# Patient Record
Sex: Female | Born: 1985
Health system: Southern US, Community
[De-identification: ages and names within clinical notes are randomized; demographics above are authoritative.]

## PROBLEM LIST (undated history)

## (undated) ENCOUNTER — Ambulatory Visit: Admission: EM | Payer: BC Managed Care – PPO | Source: Home / Self Care

## (undated) DIAGNOSIS — C801 Malignant (primary) neoplasm, unspecified: Secondary | ICD-10-CM

## (undated) DIAGNOSIS — K602 Anal fissure, unspecified: Secondary | ICD-10-CM

## (undated) DIAGNOSIS — R519 Headache, unspecified: Secondary | ICD-10-CM

## (undated) DIAGNOSIS — F32A Depression, unspecified: Secondary | ICD-10-CM

## (undated) DIAGNOSIS — F909 Attention-deficit hyperactivity disorder, unspecified type: Secondary | ICD-10-CM

## (undated) DIAGNOSIS — T7422XA Child sexual abuse, confirmed, initial encounter: Secondary | ICD-10-CM

## (undated) DIAGNOSIS — R87629 Unspecified abnormal cytological findings in specimens from vagina: Secondary | ICD-10-CM

## (undated) DIAGNOSIS — F419 Anxiety disorder, unspecified: Secondary | ICD-10-CM

## (undated) DIAGNOSIS — R51 Headache: Secondary | ICD-10-CM

## (undated) DIAGNOSIS — D219 Benign neoplasm of connective and other soft tissue, unspecified: Secondary | ICD-10-CM

## (undated) DIAGNOSIS — B009 Herpesviral infection, unspecified: Secondary | ICD-10-CM

## (undated) DIAGNOSIS — F329 Major depressive disorder, single episode, unspecified: Secondary | ICD-10-CM

## (undated) HISTORY — PX: ANUS SURGERY: SHX302

## (undated) HISTORY — DX: Unspecified abnormal cytological findings in specimens from vagina: R87.629

## (undated) HISTORY — PX: TONSILLECTOMY: SUR1361

## (undated) HISTORY — PX: BASAL CELL CARCINOMA EXCISION: SHX1214

## (undated) HISTORY — DX: Depression, unspecified: F32.A

## (undated) HISTORY — DX: Child sexual abuse, confirmed, initial encounter: T74.22XA

## (undated) HISTORY — DX: Headache: R51

## (undated) HISTORY — DX: Attention-deficit hyperactivity disorder, unspecified type: F90.9

## (undated) HISTORY — DX: Anxiety disorder, unspecified: F41.9

## (undated) HISTORY — DX: Major depressive disorder, single episode, unspecified: F32.9

## (undated) HISTORY — DX: Benign neoplasm of connective and other soft tissue, unspecified: D21.9

## (undated) HISTORY — DX: Anal fissure, unspecified: K60.2

## (undated) HISTORY — DX: Herpesviral infection, unspecified: B00.9

## (undated) HISTORY — DX: Headache, unspecified: R51.9

---

## 2007-12-21 ENCOUNTER — Emergency Department: Payer: Self-pay | Admitting: Emergency Medicine

## 2007-12-21 ENCOUNTER — Other Ambulatory Visit: Payer: Self-pay

## 2010-04-12 ENCOUNTER — Ambulatory Visit: Payer: Self-pay | Admitting: Internal Medicine

## 2010-05-04 ENCOUNTER — Emergency Department: Payer: Self-pay | Admitting: Emergency Medicine

## 2010-05-06 ENCOUNTER — Ambulatory Visit: Payer: Self-pay | Admitting: Emergency Medicine

## 2010-10-27 ENCOUNTER — Observation Stay: Payer: Self-pay

## 2011-06-06 ENCOUNTER — Ambulatory Visit: Payer: Self-pay | Admitting: Surgery

## 2012-10-19 ENCOUNTER — Ambulatory Visit: Payer: Self-pay | Admitting: Obstetrics and Gynecology

## 2013-01-14 ENCOUNTER — Ambulatory Visit: Payer: Self-pay | Admitting: Otolaryngology

## 2014-01-01 ENCOUNTER — Observation Stay: Payer: Self-pay | Admitting: Obstetrics and Gynecology

## 2014-01-01 LAB — PIH PROFILE
AST: 18 U/L (ref 15–37)
Anion Gap: 9 (ref 7–16)
BUN: 9 mg/dL (ref 7–18)
CREATININE: 0.9 mg/dL (ref 0.60–1.30)
Calcium, Total: 8.6 mg/dL (ref 8.5–10.1)
Chloride: 109 mmol/L — ABNORMAL HIGH (ref 98–107)
Co2: 21 mmol/L (ref 21–32)
EGFR (Non-African Amer.): >60
Glucose: 84 mg/dL (ref 65–99)
HCT: 41.1 % (ref 35.0–47.0)
HGB: 13.7 g/dL (ref 12.0–16.0)
MCH: 29.1 pg (ref 26.0–34.0)
MCHC: 33.4 g/dL (ref 32.0–36.0)
MCV: 87 fL (ref 80–100)
Osmolality: 275 (ref 275–301)
Platelet: 268 10*3/uL (ref 150–440)
Potassium: 4 mmol/L (ref 3.5–5.1)
RBC: 4.73 10*6/uL (ref 3.80–5.20)
RDW: 14.5 % (ref 11.5–14.5)
Sodium: 139 mmol/L (ref 136–145)
Uric Acid: 5.5 mg/dL (ref 2.6–6.0)
WBC: 11.4 10*3/uL — ABNORMAL HIGH (ref 3.6–11.0)

## 2014-01-10 ENCOUNTER — Observation Stay: Payer: Self-pay | Admitting: Obstetrics and Gynecology

## 2014-01-17 ENCOUNTER — Observation Stay: Payer: Self-pay | Admitting: Obstetrics and Gynecology

## 2014-01-21 ENCOUNTER — Ambulatory Visit: Payer: Self-pay | Admitting: Obstetrics and Gynecology

## 2014-01-21 ENCOUNTER — Observation Stay: Payer: Self-pay | Admitting: Obstetrics and Gynecology

## 2014-01-21 LAB — CBC WITH DIFFERENTIAL/PLATELET
BASOS PCT: 0.2 %
Basophil #: 0 10*3/uL (ref 0.0–0.1)
EOS ABS: 0.1 10*3/uL (ref 0.0–0.7)
Eosinophil %: 0.6 %
HCT: 42.4 % (ref 35.0–47.0)
HGB: 14.1 g/dL (ref 12.0–16.0)
Lymphocyte #: 2 10*3/uL (ref 1.0–3.6)
Lymphocyte %: 19.7 %
MCH: 29.1 pg (ref 26.0–34.0)
MCHC: 33.2 g/dL (ref 32.0–36.0)
MCV: 88 fL (ref 80–100)
MONO ABS: 0.6 x10 3/mm (ref 0.2–0.9)
Monocyte %: 6.1 %
Neutrophil #: 7.4 10*3/uL — ABNORMAL HIGH (ref 1.4–6.5)
Neutrophil %: 73.4 %
Platelet: 247 10*3/uL (ref 150–440)
RBC: 4.84 10*6/uL (ref 3.80–5.20)
RDW: 15.6 % — ABNORMAL HIGH (ref 11.5–14.5)
WBC: 10 10*3/uL (ref 3.6–11.0)

## 2014-01-24 ENCOUNTER — Emergency Department: Payer: Self-pay | Admitting: Emergency Medicine

## 2014-01-24 ENCOUNTER — Inpatient Hospital Stay: Payer: Self-pay | Admitting: Obstetrics and Gynecology

## 2014-01-25 LAB — HEMATOCRIT: HCT: 38.4 % (ref 35.0–47.0)

## 2014-06-15 LAB — HEMOGLOBIN A1C: Hgb A1c MFr Bld: 5.3 % (ref 4.0–6.0)

## 2014-06-15 LAB — LIPID PANEL
CHOLESTEROL: 145 mg/dL (ref 0–200)
HDL: 35 mg/dL (ref 35–70)
LDL Cholesterol: 87 mg/dL
TRIGLYCERIDES: 113 mg/dL (ref 40–160)

## 2014-06-15 LAB — BASIC METABOLIC PANEL: GLUCOSE: 101 mg/dL

## 2014-06-15 LAB — TSH: TSH: 1.39 u[IU]/mL (ref 0.41–5.90)

## 2014-08-06 NOTE — Op Note (Signed)
PATIENT NAME:  Sarah Lawrence, HACKEL MR#:  [id number] DATE OF BIRTH:  February 15, 1986  DATE OF PROCEDURE:  01/24/2014  PREOPERATIVE DIAGNOSES:  1.  Intrauterine pregnancy at [redacted] weeks gestation.  2.  Prior Cesarean section x 1.   POSTOPERATIVE DIAGNOSES:  1.  Intrauterine pregnancy at [redacted] weeks gestation.  2.  Prior Cesarean section x 1.  3.  Status post repeat low transverse Cesarean section.    PROCEDURE: Low transverse Cesarean section.   SURGEON: Rubie Maid, MD.    ASSISTANT: Barron Alvine, PA student.   ANESTHESIA:  Spinal.     FINDINGS: Cephalic presentation, female infant, Apgars of 9 and 9 at 1 and 5 minutes respectively.  Clear amniotic fluid at rupture. Adhesions of the uterus to the anterior abdominal wall, unable to visualize uterine outline, tubes, or ovaries.  Placenta appeared normal with 3 vessel cord.   ESTIMATED BLOOD LOSS: 800 mL.   URINE OUTPUT: 95 mL.    INTRAVENOUS FLUIDS: 1800 mL.    DRAINS: Foley catheter to gravity.   ANTIBIOTICS: 2 grams Ancef were given prior to the procedure.   SPECIMEN: Placenta.   COMPLICATIONS: There were none.   PROCEDURE: The patient was taken to the operating room where she was placed under spinal anesthesia without difficulty. She was then prepped and draped in normal fashion and placed in the dorsal supine position with leftward tilt. Spinal was found to be adequate. A Pfannenstiel incision was made and carried down through the subcutaneous tissue to the fascia using the Bovie. Fascia was then incised in the midline and the incision was extended laterally. The superior aspect of the fascial incision was then grasped Kober clamps, elevated, and the underlying rectus muscles were dissected off superiorly using sharp dissection. Attention was then turned inferiorly which in a similar fashion was grasped, tented up with Kocher clamps, and the rectus muscles were dissected off. The rectus muscles were then separated in the midline. The  peritoneum was identified, tented up, and entered sharply with Metzenbaum scissors.  Peritoneal incision was then extended superiorly and inferiorly with good visualization of the bladder.  The uterovesical peritoneal reflection was incised transversely and the bladder flap was bluntly freed from the lower uterine segment. The bladder blade was then inserted. A low transverse uterine incision was then made along the lower uterine segment with the scalpel.  Uterine incision was then extended laterally and superiorly bluntly. The bladder blade was removed and the infant's head was delivered atraumatically. There was no nuchal cord present. The nose and mouth were suctioned, The remainder of the infant was delivered without difficulty and the infant was handed off to the pediatricians. Delivered from vertex presentation was a 4250 gram female with Apgars of 9 and 9 at 1 and 5 minutes respectively. The umbilical cord was then clamped and cut. The placenta was removed manually and intact and appeared normal. The uterus was attempted to be exteriorized; however due to the dense abdominal adhesions the uterus could not be exteriorized. The uterus was then cleared of all clots and debris. The incision was then closed with running locked sutures of 0 Vicryl. A second suture was performed in an imbricating layer using 0 Vicryl. Hemostasis was observed. The gutters were cleared of all clots and debris. The fascia was then reapproximated with running suture of 1-0 Vicryl in a continuous fashion. Subcutaneous fat was reapproximated with a 3-0 Vicryl and interrupted sutures. The skin was reapproximated with 4-0 Monocryl. Dermabond was placed over the incision and  after this a pressure dressing was placed. Instrument, sponge, and needle counts were correct x 2 at the end of the procedure. The patient tolerated the procedure well. She and infant were taken to the recovery room in stable condition.     ____________________________ Chesley Noon Marcelline Mates, MD asc:bu D: 01/24/2014 14:55:42 ET T: 01/24/2014 15:23:27 ET JOB#: [id number]  cc: Chesley Noon. Marcelline Mates, MD, <Dictator> Augusto Gamble MD ELECTRONICALLY SIGNED 01/28/2014 23:52

## 2014-08-07 NOTE — Op Note (Signed)
PATIENT NAME:  Sarah Lawrence, Sarah Lawrence MR#:  [id number] DATE OF BIRTH:  Apr 26, 1985  DATE OF PROCEDURE:  06/06/2011  PREOPERATIVE DIAGNOSIS: Chronic posterior anal fissure.   POSTOPERATIVE DIAGNOSIS: Chronic posterior anal fissure.  PROCEDURE: Lateral internal anal sphincterotomy.   SURGEON: Rochel Brome, MD  ANESTHESIA: General.   INDICATIONS: This 29 year old female has a chronic history of anal pain with anal fissure, has been treated, medical management has failed, and surgery was recommended for definitive treatment.   DESCRIPTION OF PROCEDURE: The patient was placed on the operating table in the supine position under general anesthesia. The legs were elevated into the lithotomy position using ankle straps. The anal area was prepared with Betadine solution and draped with sterile towels and sheets.   Initial inspection revealed a posterior anal fissure and could visualize the white color of the internal anal sphincter. Digital exam demonstrated no palpable rectal mass. The anal canal was dilated large enough to admit three fingers. The bivalve anal retractor was introduced and further dilated the anal canal. Next, the skin at the 3 o'clock position on the patient's left side was infiltrated with 1% Xylocaine with epinephrine. Next, a 12 mm incision was made at the 3 o'clock position. Several small bleeding points were cauterized. Hemostats were used to dissect down to the internal anal sphincter, which was identified with white color. The sphincter was further dissected and exposed. Next, 90% of the sphincter was divided with electrocautery. Several small bleeding points were cauterized. Hemostasis was subsequently intact. Next, the wound was partially closed with interrupted 5-0 chromic simple sutures placing three sutures       and leaving the last 5 millimeters open for drainage. Next, dressings were applied with paper tape. The patient tolerated surgery satisfactorily and is now being prepared  for transfer to the recovery room. ____________________________ Lenna Sciara. Rochel Brome, MD jws:slb D: 06/06/2011 08:17:13 ET T: 06/06/2011 09:50:33 ET JOB#: [id number]  cc: Loreli Dollar, MD, <Dictator> Loreli Dollar MD ELECTRONICALLY SIGNED 06/09/2011 15:46

## 2014-09-20 ENCOUNTER — Encounter: Payer: Self-pay | Admitting: Psychiatry

## 2014-09-20 ENCOUNTER — Ambulatory Visit (INDEPENDENT_AMBULATORY_CARE_PROVIDER_SITE_OTHER): Payer: No Typology Code available for payment source | Admitting: Psychiatry

## 2014-09-20 ENCOUNTER — Other Ambulatory Visit: Payer: Self-pay

## 2014-09-20 VITALS — BP 110/76 | HR 87 | Temp 97.9°F | Ht 65.0 in

## 2014-09-20 DIAGNOSIS — F331 Major depressive disorder, recurrent, moderate: Secondary | ICD-10-CM

## 2014-09-20 DIAGNOSIS — F4541 Pain disorder exclusively related to psychological factors: Secondary | ICD-10-CM | POA: Insufficient documentation

## 2014-09-20 DIAGNOSIS — F508 Other eating disorders: Secondary | ICD-10-CM

## 2014-09-20 DIAGNOSIS — F5081 Binge eating disorder: Secondary | ICD-10-CM

## 2014-09-20 DIAGNOSIS — F900 Attention-deficit hyperactivity disorder, predominantly inattentive type: Secondary | ICD-10-CM

## 2014-09-20 DIAGNOSIS — F419 Anxiety disorder, unspecified: Secondary | ICD-10-CM | POA: Insufficient documentation

## 2014-09-20 DIAGNOSIS — F50819 Binge eating disorder, unspecified: Secondary | ICD-10-CM

## 2014-09-20 DIAGNOSIS — B009 Herpesviral infection, unspecified: Secondary | ICD-10-CM | POA: Insufficient documentation

## 2014-09-20 DIAGNOSIS — F411 Generalized anxiety disorder: Secondary | ICD-10-CM

## 2014-09-20 MED ORDER — LISDEXAMFETAMINE DIMESYLATE 30 MG PO CAPS: 30.0000 mg | ORAL_CAPSULE | Freq: Every day | ORAL | Status: DC

## 2014-09-20 MED ORDER — ALPRAZOLAM 1 MG PO TABS: 1.0000 mg | ORAL_TABLET | Freq: Three times a day (TID) | ORAL | Status: DC | PRN

## 2014-09-20 MED ORDER — PAROXETINE HCL 30 MG PO TABS: 30.0000 mg | ORAL_TABLET | Freq: Every day | ORAL | Status: DC
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
## 2014-10-03 ENCOUNTER — Ambulatory Visit: Payer: No Typology Code available for payment source | Admitting: Psychiatry

## 2014-10-04 ENCOUNTER — Ambulatory Visit: Payer: No Typology Code available for payment source | Admitting: Licensed Clinical Social Worker

## 2014-10-14 ENCOUNTER — Ambulatory Visit: Payer: Self-pay | Admitting: Obstetrics and Gynecology

## 2014-10-14 ENCOUNTER — Ambulatory Visit: Payer: No Typology Code available for payment source | Admitting: Licensed Clinical Social Worker

## 2014-10-20 ENCOUNTER — Encounter: Payer: Self-pay | Admitting: *Deleted

## 2014-10-25 ENCOUNTER — Ambulatory Visit: Payer: No Typology Code available for payment source | Admitting: Psychiatry

## 2014-10-25 ENCOUNTER — Ambulatory Visit (INDEPENDENT_AMBULATORY_CARE_PROVIDER_SITE_OTHER): Payer: No Typology Code available for payment source | Admitting: Obstetrics and Gynecology

## 2014-10-25 ENCOUNTER — Encounter: Payer: Self-pay | Admitting: Obstetrics and Gynecology

## 2014-10-25 VITALS — BP 122/80 | HR 72 | Ht 65.0 in | Wt 246.0 lb

## 2014-10-25 DIAGNOSIS — F5081 Binge eating disorder: Secondary | ICD-10-CM

## 2014-10-25 DIAGNOSIS — F508 Other eating disorders: Secondary | ICD-10-CM | POA: Diagnosis not present

## 2014-10-25 DIAGNOSIS — F900 Attention-deficit hyperactivity disorder, predominantly inattentive type: Secondary | ICD-10-CM

## 2014-10-25 MED ORDER — PHENTERMINE HCL 37.5 MG PO TABS: 37.5000 mg | ORAL_TABLET | Freq: Every day | ORAL | Status: DC

## 2014-10-25 MED ORDER — LISDEXAMFETAMINE DIMESYLATE 30 MG PO CAPS: 30.0000 mg | ORAL_CAPSULE | Freq: Every day | ORAL | Status: DC

## 2014-10-25 MED ORDER — CYANOCOBALAMIN 1000 MCG/ML IJ SOLN: 1000.0000 ug | Freq: Once | INTRAMUSCULAR | Status: DC

## 2014-10-25 NOTE — Patient Instructions (Signed)
Exercise to Lose Weight Exercise and a healthy diet may help you lose weight. Your doctor may suggest specific exercises. EXERCISE IDEAS AND TIPS  Choose low-cost things you enjoy doing, such as walking, bicycling, or exercising to workout videos.  Take stairs instead of the elevator.  Walk during your lunch break.  Park your car further away from work or school.  Go to a gym or an exercise class.  Start with 5 to 10 minutes of exercise each day. Build up to 30 minutes of exercise 4 to 6 days a week.  Wear shoes with good support and comfortable clothes.  Stretch before and after working out.  Work out until you breathe harder and your heart beats faster.  Drink extra water when you exercise.  Do not do so much that you hurt yourself, feel dizzy, or get very short of breath. Exercises that burn about 150 calories:  Running 1  miles in 15 minutes.  Playing volleyball for 45 to 60 minutes.  Washing and waxing a car for 45 to 60 minutes.  Playing touch football for 45 minutes.  Walking 1  miles in 35 minutes.  Pushing a stroller 1  miles in 30 minutes.  Playing basketball for 30 minutes.  Raking leaves for 30 minutes.  Bicycling 5 miles in 30 minutes.  Walking 2 miles in 30 minutes.  Dancing for 30 minutes.  Shoveling snow for 15 minutes.  Swimming laps for 20 minutes.  Walking up stairs for 15 minutes.  Bicycling 4 miles in 15 minutes.  Gardening for 30 to 45 minutes.  Jumping rope for 15 minutes.  Washing windows or floors for 45 to 60 minutes. Document Released: 05/04/2010 Document Revised: 06/24/2011 Document Reviewed: 05/04/2010 Exit[employer] Patient Information 2015 [employer]. This information is not intended to replace advice given to you by your health care provider. Make sure you discuss any questions you have with your health care provider.  

## 2014-10-25 NOTE — Progress Notes (Signed)
Subjective:     Patient ID: Sarah Lawrence, female   DOB: May 02, 1985, 29 y.o.   MRN: [id number]  HPI Here to start weight loss plan  Review of Systems Has been intermittently trying to loss weight without progress, has been seen by psychiatrist and taking vyvance x 1 month and feels like it has helped with ADD but not decreased appetite as desired.    Objective:   Physical Exam A&O x4 Morbidly obese female in no distress     Assessment:     Morbid obesity Depression ADD H/o eating disorder- binge eating     Plan:     Weight loss management including adipex 37.5mg  daily, and monthly B12 injections- RTC 4 weeks for recheck Committed to 19minutes of exercise at least 4 days a week Plans to restart Herbalife shakes  rx for vyvance 30mg  daily given- will consider increasing dose if no change seen with adding adipex.      Melody Trudee Kuster, CNM

## 2014-11-22 ENCOUNTER — Ambulatory Visit (INDEPENDENT_AMBULATORY_CARE_PROVIDER_SITE_OTHER): Payer: No Typology Code available for payment source | Admitting: Obstetrics and Gynecology

## 2014-11-22 VITALS — BP 118/72 | HR 74 | Ht 65.0 in | Wt 237.9 lb

## 2014-11-22 DIAGNOSIS — E669 Obesity, unspecified: Secondary | ICD-10-CM

## 2014-11-22 MED ORDER — CYANOCOBALAMIN 1000 MCG/ML IJ SOLN
1000.0000 ug | Freq: Once | INTRAMUSCULAR | Status: AC
  Administered 2014-11-22: 1000 ug via INTRAMUSCULAR

## 2014-11-22 MED ORDER — LISDEXAMFETAMINE DIMESYLATE 40 MG PO CAPS: 40.0000 mg | ORAL_CAPSULE | ORAL | Status: DC

## 2014-11-22 NOTE — Progress Notes (Cosign Needed)
Pt is here for wt, bp check, b-12 inj She is doing great with her diet and is tolerating medication well [id number] 10/25/2014

## 2014-12-20 ENCOUNTER — Ambulatory Visit (INDEPENDENT_AMBULATORY_CARE_PROVIDER_SITE_OTHER): Payer: Managed Care, Other (non HMO) | Admitting: Obstetrics and Gynecology

## 2014-12-20 VITALS — BP 109/76 | HR 93 | Ht 65.0 in | Wt 232.6 lb

## 2014-12-20 DIAGNOSIS — E669 Obesity, unspecified: Secondary | ICD-10-CM | POA: Diagnosis not present

## 2014-12-20 MED ORDER — CYANOCOBALAMIN 1000 MCG/ML IJ SOLN
1000.0000 ug | Freq: Once | INTRAMUSCULAR | Status: AC
  Administered 2014-12-20: 1000 ug via INTRAMUSCULAR

## 2014-12-20 NOTE — Progress Notes (Cosign Needed)
Patient ID: Sarah Lawrence, female   DOB: [birth year], 29 y.o.   MRN: [id number] Pt presents for weight, B/P check and B-12 injection.  Has lost 5 lbs since last visit. No c/o side effects of medication.  Does need refills for:  Alprazolam, Adderall, Vyvanse, and Paxil per pt. Pt also mentioned she would like a tubal in the spring and will make an appt with Dr. Marcelline Mates.

## 2014-12-21 ENCOUNTER — Telehealth: Payer: Self-pay | Admitting: Obstetrics and Gynecology

## 2014-12-21 ENCOUNTER — Other Ambulatory Visit: Payer: Self-pay | Admitting: Obstetrics and Gynecology

## 2014-12-21 DIAGNOSIS — F331 Major depressive disorder, recurrent, moderate: Secondary | ICD-10-CM

## 2014-12-21 DIAGNOSIS — F411 Generalized anxiety disorder: Secondary | ICD-10-CM

## 2014-12-21 MED ORDER — PAROXETINE HCL 30 MG PO TABS: 30.0000 mg | ORAL_TABLET | Freq: Every day | ORAL | Status: DC

## 2014-12-21 MED ORDER — LISDEXAMFETAMINE DIMESYLATE 40 MG PO CAPS: 40.0000 mg | ORAL_CAPSULE | ORAL | Status: DC

## 2014-12-21 MED ORDER — ALPRAZOLAM 1 MG PO TABS: 1.0000 mg | ORAL_TABLET | Freq: Three times a day (TID) | ORAL | Status: DC | PRN

## 2014-12-21 NOTE — Telephone Encounter (Signed)
Patient called requesting refills on paxil 30 mg, alprazolam 1 mg, and vyvanc 40 mg to [facility] in [place]

## 2014-12-21 NOTE — Telephone Encounter (Signed)
Patient called stating she has thrush due to the increased dosage on her ADD meds. She is requesting a script for mouthwash for the thrush.Thanks

## 2014-12-21 NOTE — Telephone Encounter (Signed)
Please let her know it is done 

## 2014-12-22 NOTE — Telephone Encounter (Signed)
Called pt.

## 2014-12-23 NOTE — Telephone Encounter (Signed)
Spoke with pt gave her some tips on dry mouth she voiced understanding

## 2015-01-13 ENCOUNTER — Telehealth: Payer: Self-pay | Admitting: Obstetrics and Gynecology

## 2015-01-13 NOTE — Telephone Encounter (Signed)
PT CALLED AND CX HER NURSE VISIT SHE HAS LOST 20 POUNDS AND SHE IS CONTENT WITH THAT BUT SHE WANTED TO KNOW IF YOU NEEDED TO SEE HER TO F/U WITH THE WEIGH LOSS OR IS SHE OK NOT TO BE SEEN, SHE SAID SHE WILL DO WHATEVER YOU NEED HER TO DO.

## 2015-01-17 ENCOUNTER — Ambulatory Visit: Payer: Self-pay

## 2015-01-17 NOTE — Telephone Encounter (Signed)
Spoke with pt she does want to continue to see MNB

## 2015-01-17 NOTE — Telephone Encounter (Signed)
I would still like to see her, as i usually have them continue with medication until BMI is <27, after a 2 week break of course. Now if she is not wanting to do that no need for an appointment.

## 2015-01-23 ENCOUNTER — Telehealth: Payer: Self-pay | Admitting: Obstetrics and Gynecology

## 2015-01-23 NOTE — Telephone Encounter (Signed)
pls advise

## 2015-01-23 NOTE — Telephone Encounter (Signed)
SHE NEEDS THE REMAINDER REFILLED ON HER RX B/C PHARMACY WOULD ONLY REFILL 90 OF THE 132 THAT MEL SENT. Duanne Moron   CELL [phone]

## 2015-01-24 NOTE — Telephone Encounter (Signed)
Left detailed message about medication 

## 2015-01-24 NOTE — Telephone Encounter (Signed)
I am not surprised- as they have regulation as to how many they can dispense at a time, tell her she will have to just go pick up remainder when they tell her she can.

## 2015-01-27 ENCOUNTER — Other Ambulatory Visit: Payer: Self-pay | Admitting: Obstetrics and Gynecology

## 2015-01-27 ENCOUNTER — Telehealth: Payer: Self-pay | Admitting: *Deleted

## 2015-01-27 DIAGNOSIS — F411 Generalized anxiety disorder: Secondary | ICD-10-CM

## 2015-01-27 MED ORDER — ALPRAZOLAM 1 MG PO TABS: 1.0000 mg | ORAL_TABLET | Freq: Three times a day (TID) | ORAL | Status: DC | PRN

## 2015-01-27 NOTE — Telephone Encounter (Signed)
Let her know it was done.

## 2015-01-27 NOTE — Telephone Encounter (Signed)
Pt needs rx for xanax takes 3x daily

## 2015-01-27 NOTE — Telephone Encounter (Signed)
Faxed rx to pharmacy  

## 2015-02-21 ENCOUNTER — Ambulatory Visit: Payer: Self-pay | Admitting: Obstetrics and Gynecology

## 2015-02-24 ENCOUNTER — Other Ambulatory Visit: Payer: Self-pay | Admitting: Obstetrics and Gynecology

## 2015-03-21 ENCOUNTER — Ambulatory Visit: Payer: Self-pay | Admitting: Obstetrics and Gynecology

## 2015-04-11 ENCOUNTER — Telehealth: Payer: Self-pay | Admitting: Obstetrics and Gynecology

## 2015-04-11 NOTE — Telephone Encounter (Signed)
pls advise

## 2015-04-11 NOTE — Telephone Encounter (Signed)
PT NEEDS VIVANCE REFILLED 40 MG. SHE HAS HER AE [facility] 1/19 [facility]

## 2015-04-11 NOTE — Telephone Encounter (Signed)
Please let her know it is ready for pick up

## 2015-04-11 NOTE — Telephone Encounter (Signed)
Notified pt she will pick up 04/12/15

## 2015-05-04 ENCOUNTER — Encounter: Payer: Self-pay | Admitting: Obstetrics and Gynecology

## 2015-05-04 ENCOUNTER — Ambulatory Visit (INDEPENDENT_AMBULATORY_CARE_PROVIDER_SITE_OTHER): Payer: Managed Care, Other (non HMO) | Admitting: Obstetrics and Gynecology

## 2015-05-04 ENCOUNTER — Ambulatory Visit: Payer: Self-pay | Admitting: Obstetrics and Gynecology

## 2015-05-04 ENCOUNTER — Other Ambulatory Visit: Payer: Self-pay | Admitting: Obstetrics and Gynecology

## 2015-05-04 VITALS — BP 120/68 | HR 74 | Ht 65.0 in | Wt 242.7 lb

## 2015-05-04 DIAGNOSIS — F411 Generalized anxiety disorder: Secondary | ICD-10-CM

## 2015-05-04 DIAGNOSIS — Z01419 Encounter for gynecological examination (general) (routine) without abnormal findings: Secondary | ICD-10-CM

## 2015-05-04 MED ORDER — LISDEXAMFETAMINE DIMESYLATE 40 MG PO CAPS: 40.0000 mg | ORAL_CAPSULE | ORAL | Status: DC

## 2015-05-04 MED ORDER — ALPRAZOLAM 1 MG PO TABS: 1.0000 mg | ORAL_TABLET | Freq: Three times a day (TID) | ORAL | Status: DC | PRN

## 2015-05-04 MED ORDER — FLUOXETINE HCL 20 MG PO CAPS: 20.0000 mg | ORAL_CAPSULE | Freq: Every day | ORAL | Status: DC

## 2015-05-04 NOTE — Progress Notes (Signed)
  Subjective:     Sarah Lawrence is a 30 y.o. female and is here for a comprehensive physical exam. The patient reports worsening anxiety due to child's health issues, also desires restarting weight loss medications.  Social History   Social History  . Marital Status: Married    Spouse Name: N/A  . Number of Children: N/A  . Years of Education: N/A   Occupational History  . Not on file.   Social History Main Topics  . Smoking status: Never Smoker   . Smokeless tobacco: Never Used  . Alcohol Use: 0.6 oz/week    1 Glasses of wine, 0 Standard drinks or equivalent per week  . Drug Use: No  . Sexual Activity: Yes    Birth Control/ Protection: Pill   Other Topics Concern  . Not on file   Social History Narrative   Health Maintenance  Topic Date Due  . HIV Screening  05/05/2001  . TETANUS/TDAP  05/05/2005  . PAP SMEAR  05/06/2007  . INFLUENZA VACCINE  11/14/2014    The following portions of the patient's history were reviewed and updated as appropriate: allergies, current medications, past family history, past medical history, past social history, past surgical history and problem list.  Review of Systems A comprehensive review of systems was negative except for: Behavioral/Psych: positive for anxiety and obesity   Objective:    General appearance: alert, cooperative, appears stated age and morbidly obese Neck: no adenopathy, no carotid bruit, no JVD, supple, symmetrical, trachea midline and thyroid not enlarged, symmetric, no tenderness/mass/nodules Lungs: clear to auscultation bilaterally Breasts: normal appearance, no masses or tenderness Heart: regular rate and rhythm, S1, S2 normal, no murmur, click, rub or gallop Abdomen: soft, non-tender; bowel sounds normal; no masses,  no organomegaly Pelvic: cervix normal in appearance, external genitalia normal, no adnexal masses or tenderness, no cervical motion tenderness, rectovaginal septum normal, uterus normal size, shape,  and consistency and vagina normal without discharge    Assessment:    Healthy female exam. Anxiety disorder- not under good control with current SSRI; morbid obesity , ADD     Plan:  Pap and labs obtained, to stop Paxil and start prozac- recheck in 2 weeks. meds refilled Will restart weight loss program in 2 weeks    See After Visit Summary for Counseling Recommendations

## 2015-05-04 NOTE — Patient Instructions (Signed)
  Place annual gynecologic exam patient instructions here.  Thank you for enrolling in [facility]. Please follow the instructions below to securely access your online medical record. MyChart allows you to send messages to your doctor, view your test results, manage appointments, and more.   How Do I Sign Up? 1. In your Internet browser, go to [employer] and enter [email or web address]. 2. Click on the Sign Up Now link in the Sign In box. You will see the New Member Sign Up page. 3. Enter your MyChart Access Code exactly as it appears below. You will not need to use this code after you've completed the sign-up process. If you do not sign up before the expiration date, you must request a new code.  MyChart Access Code: [email or web address] Expires: 05/19/2015 10:35 AM  4. Enter your Social Security Number ([id number]) and Date of Birth (mm/dd/yyyy) as indicated and click Submit. You will be taken to the next sign-up page. 5. Create a MyChart ID. This will be your MyChart login ID and cannot be changed, so think of one that is secure and easy to remember. 6. Create a MyChart password. You can change your password at any time. 7. Enter your Password Reset Question and Answer. This can be used at a later time if you forget your password.  8. Enter your e-mail address. You will receive e-mail notification when new information is available in [address]. 9. Click Sign Up. You can now view your medical record.   Additional Information Remember, MyChart is NOT to be used for urgent needs. For medical emergencies, dial 911.

## 2015-05-05 LAB — LIPID PANEL
CHOL/HDL RATIO: 5.3 ratio — AB (ref 0.0–4.4)
Cholesterol, Total: 168 mg/dL (ref 100–199)
HDL: 32 mg/dL — ABNORMAL LOW (ref >39)
LDL CALC: 96 mg/dL (ref 0–99)
TRIGLYCERIDES: 201 mg/dL — AB (ref 0–149)
VLDL CHOLESTEROL CAL: 40 mg/dL (ref 5–40)

## 2015-05-05 LAB — COMPREHENSIVE METABOLIC PANEL
A/G RATIO: 1.5 (ref 1.1–2.5)
ALBUMIN: 4.4 g/dL (ref 3.5–5.5)
ALT: 14 IU/L (ref 0–32)
AST: 13 IU/L (ref 0–40)
Alkaline Phosphatase: 140 IU/L — ABNORMAL HIGH (ref 39–117)
BILIRUBIN TOTAL: 0.4 mg/dL (ref 0.0–1.2)
BUN / CREAT RATIO: 13 (ref 8–20)
BUN: 12 mg/dL (ref 6–20)
CALCIUM: 9.4 mg/dL (ref 8.7–10.2)
CO2: 19 mmol/L (ref 18–29)
Chloride: 101 mmol/L (ref 96–106)
Creatinine, Ser: 0.92 mg/dL (ref 0.57–1.00)
GFR, EST AFRICAN AMERICAN: 98 mL/min/{1.73_m2} (ref >59)
GFR, EST NON AFRICAN AMERICAN: 85 mL/min/{1.73_m2} (ref >59)
Globulin, Total: 2.9 g/dL (ref 1.5–4.5)
Glucose: 89 mg/dL (ref 65–99)
Potassium: 4.4 mmol/L (ref 3.5–5.2)
SODIUM: 140 mmol/L (ref 134–144)
TOTAL PROTEIN: 7.3 g/dL (ref 6.0–8.5)

## 2015-05-05 LAB — RPR: RPR Ser Ql: NONREACTIVE

## 2015-05-05 LAB — CYTOLOGY - PAP

## 2015-05-05 LAB — HEPATITIS C ANTIBODY: Hep C Virus Ab: <0.1 s/co ratio (ref 0.0–0.9)

## 2015-05-05 LAB — HEMOGLOBIN A1C
Est. average glucose Bld gHb Est-mCnc: 103 mg/dL
Hgb A1c MFr Bld: 5.2 % (ref 4.8–5.6)

## 2015-05-05 LAB — THYROID PANEL WITH TSH
Free Thyroxine Index: 2 (ref 1.2–4.9)
T3 Uptake Ratio: 21 % — ABNORMAL LOW (ref 24–39)
T4, Total: 9.7 ug/dL (ref 4.5–12.0)
TSH: 1.62 u[IU]/mL (ref 0.450–4.500)

## 2015-05-05 LAB — HEPATITIS B SURFACE ANTIGEN: HEP B S AG: NEGATIVE

## 2015-05-05 LAB — HIV ANTIBODY (ROUTINE TESTING W REFLEX): HIV Screen 4th Generation wRfx: NONREACTIVE

## 2015-05-08 NOTE — Telephone Encounter (Signed)
Left detailed message about pts labs

## 2015-05-08 NOTE — Telephone Encounter (Signed)
-----   Message from Joylene Igo, [place] sent at 05/08/2015 11:06 AM EST ----- Please let her know all labs look fine

## 2015-05-15 ENCOUNTER — Telehealth: Payer: Self-pay | Admitting: Obstetrics and Gynecology

## 2015-05-15 NOTE — Telephone Encounter (Signed)
pls advise

## 2015-05-15 NOTE — Telephone Encounter (Signed)
PT WAS ON PROZAC FOR 8 DAYS AND GAVE HER BAD SIDE EFFECTS. // MOOD SWINGS, CRYING A LOT., MADE HER MEAN. SHE STOPPED IT AND WH=ANTS TO KNOW WHAT SHE CAN DO. ANOTHER MEDICATION?

## 2015-05-17 ENCOUNTER — Ambulatory Visit: Payer: Self-pay

## 2015-05-17 ENCOUNTER — Telehealth: Payer: Self-pay | Admitting: Obstetrics and Gynecology

## 2015-05-17 NOTE — Telephone Encounter (Signed)
PT CALLED AND SHE HAS BEEN OFF HER MEDICATION FOR AL,OST 10 DAYS THE MEDICATION THE MELODY CHANGED IT, SHE WAS HAVING REALLY BAD SIDE EFFECT AND STOPPED TAKING IT AND SHE REALLY NEEDS TO GET ON SOMETHING. PT CALLED IN ON Monday AND HAS NOT HEARD ANYTHING.

## 2015-05-17 NOTE — Telephone Encounter (Signed)
If the medication made her feel that bad, I do not feel comfortable continue to "Try" different medications. I think she needs to be seen and evaluated by a specialist.  If she has someone she would like to see I will be glad to place a referral.

## 2015-05-18 NOTE — Telephone Encounter (Signed)
Notified pt- she says any suggestions would be great

## 2015-05-24 ENCOUNTER — Ambulatory Visit: Payer: Self-pay

## 2015-05-30 ENCOUNTER — Other Ambulatory Visit: Payer: Self-pay | Admitting: Obstetrics and Gynecology

## 2015-05-30 DIAGNOSIS — F419 Anxiety disorder, unspecified: Secondary | ICD-10-CM

## 2015-05-30 NOTE — Telephone Encounter (Signed)
Please let her know when referral placed,

## 2015-05-31 ENCOUNTER — Emergency Department: Payer: Managed Care, Other (non HMO)

## 2015-05-31 ENCOUNTER — Encounter: Payer: Self-pay | Admitting: *Deleted

## 2015-05-31 ENCOUNTER — Emergency Department
Admission: EM | Admit: 2015-05-31 | Discharge: 2015-05-31 | Disposition: A | Discharge status: Home / Self Care | Payer: Managed Care, Other (non HMO) | Attending: Emergency Medicine | Admitting: Emergency Medicine

## 2015-05-31 DIAGNOSIS — Z3202 Encounter for pregnancy test, result negative: Secondary | ICD-10-CM | POA: Diagnosis not present

## 2015-05-31 DIAGNOSIS — Y9389 Activity, other specified: Secondary | ICD-10-CM | POA: Insufficient documentation

## 2015-05-31 DIAGNOSIS — Y998 Other external cause status: Secondary | ICD-10-CM | POA: Insufficient documentation

## 2015-05-31 DIAGNOSIS — F419 Anxiety disorder, unspecified: Secondary | ICD-10-CM | POA: Insufficient documentation

## 2015-05-31 DIAGNOSIS — S199XXA Unspecified injury of neck, initial encounter: Secondary | ICD-10-CM | POA: Diagnosis present

## 2015-05-31 DIAGNOSIS — E669 Obesity, unspecified: Secondary | ICD-10-CM | POA: Diagnosis not present

## 2015-05-31 DIAGNOSIS — S299XXA Unspecified injury of thorax, initial encounter: Secondary | ICD-10-CM | POA: Insufficient documentation

## 2015-05-31 DIAGNOSIS — Z79899 Other long term (current) drug therapy: Secondary | ICD-10-CM | POA: Insufficient documentation

## 2015-05-31 DIAGNOSIS — S0990XA Unspecified injury of head, initial encounter: Secondary | ICD-10-CM | POA: Insufficient documentation

## 2015-05-31 DIAGNOSIS — Z792 Long term (current) use of antibiotics: Secondary | ICD-10-CM | POA: Diagnosis not present

## 2015-05-31 DIAGNOSIS — Y92410 Unspecified street and highway as the place of occurrence of the external cause: Secondary | ICD-10-CM | POA: Diagnosis not present

## 2015-05-31 DIAGNOSIS — S3991XA Unspecified injury of abdomen, initial encounter: Secondary | ICD-10-CM | POA: Diagnosis not present

## 2015-05-31 LAB — CBC WITH DIFFERENTIAL/PLATELET
BASOS PCT: 1 %
Basophils Absolute: 0.1 10*3/uL (ref 0–0.1)
EOS PCT: 1 %
Eosinophils Absolute: 0.2 10*3/uL (ref 0–0.7)
HEMATOCRIT: 42.9 % (ref 35.0–47.0)
Hemoglobin: 14.9 g/dL (ref 12.0–16.0)
Lymphocytes Relative: 21 %
Lymphs Abs: 2.8 10*3/uL (ref 1.0–3.6)
MCH: 29.7 pg (ref 26.0–34.0)
MCHC: 34.7 g/dL (ref 32.0–36.0)
MCV: 85.7 fL (ref 80.0–100.0)
MONO ABS: 0.7 10*3/uL (ref 0.2–0.9)
MONOS PCT: 5 %
NEUTROS ABS: 9.6 10*3/uL — AB (ref 1.4–6.5)
Neutrophils Relative %: 72 %
PLATELETS: 386 10*3/uL (ref 150–440)
RBC: 5 MIL/uL (ref 3.80–5.20)
RDW: 13.5 % (ref 11.5–14.5)
WBC: 13.4 10*3/uL — ABNORMAL HIGH (ref 3.6–11.0)

## 2015-05-31 LAB — COMPREHENSIVE METABOLIC PANEL
ALBUMIN: 4.3 g/dL (ref 3.5–5.0)
ALT: 20 U/L (ref 14–54)
ANION GAP: 7 (ref 5–15)
AST: 20 U/L (ref 15–41)
Alkaline Phosphatase: 112 U/L (ref 38–126)
BILIRUBIN TOTAL: 0.7 mg/dL (ref 0.3–1.2)
BUN: 12 mg/dL (ref 6–20)
CO2: 23 mmol/L (ref 22–32)
Calcium: 9.2 mg/dL (ref 8.9–10.3)
Chloride: 107 mmol/L (ref 101–111)
Creatinine, Ser: 0.8 mg/dL (ref 0.44–1.00)
GFR calc Af Amer: >60 mL/min (ref >60)
GLUCOSE: 91 mg/dL (ref 65–99)
POTASSIUM: 3.8 mmol/L (ref 3.5–5.1)
Sodium: 137 mmol/L (ref 135–145)
TOTAL PROTEIN: 7.8 g/dL (ref 6.5–8.1)

## 2015-05-31 LAB — HCG, QUANTITATIVE, PREGNANCY: HCG, BETA CHAIN, QUANT, S: 1 m[IU]/mL (ref <5)

## 2015-05-31 MED ORDER — IOHEXOL 300 MG/ML  SOLN
125.0000 mL | Freq: Once | INTRAMUSCULAR | Status: AC | PRN
  Administered 2015-05-31: 125 mL via INTRAVENOUS
  Filled 2015-05-31: qty 125

## 2015-05-31 MED ORDER — OXYCODONE-ACETAMINOPHEN 5-325 MG PO TABS: 1.0000 | ORAL_TABLET | Freq: Four times a day (QID) | ORAL | Status: DC | PRN

## 2015-05-31 MED ORDER — ONDANSETRON HCL 4 MG/2ML IJ SOLN
4.0000 mg | Freq: Once | INTRAMUSCULAR | Status: AC
  Administered 2015-05-31: 4 mg via INTRAVENOUS

## 2015-05-31 MED ORDER — FENTANYL CITRATE (PF) 100 MCG/2ML IJ SOLN
INTRAMUSCULAR | Status: AC
  Administered 2015-05-31: 50 ug via INTRAVENOUS
  Filled 2015-05-31: qty 2

## 2015-05-31 MED ORDER — FENTANYL CITRATE (PF) 100 MCG/2ML IJ SOLN
50.0000 ug | Freq: Once | INTRAMUSCULAR | Status: AC
  Administered 2015-05-31: 50 ug via INTRAVENOUS

## 2015-05-31 MED ORDER — ONDANSETRON HCL 4 MG/2ML IJ SOLN
INTRAMUSCULAR | Status: AC
  Administered 2015-05-31: 4 mg via INTRAVENOUS
  Filled 2015-05-31: qty 2

## 2015-05-31 NOTE — ED Notes (Signed)
C-collar intact, pt assisted in bed from wheelchair, tearful during assessment, Dr Burlene Arnt at bedside. Family at bedside.
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
## 2015-06-02 NOTE — Telephone Encounter (Signed)
Left a message with Lennon Alstrom. She will contact the patient directly to schedule an appointment.

## 2015-07-24 ENCOUNTER — Telehealth: Payer: Self-pay | Admitting: Obstetrics and Gynecology

## 2015-07-24 NOTE — Telephone Encounter (Signed)
pls advise

## 2015-07-24 NOTE — Telephone Encounter (Signed)
Pt is going to dr Holley Raring, medication situation is figured out. She is on Wellbutrin,    she needs alprazaman, vanse refilled    [facility]

## 2015-07-25 ENCOUNTER — Other Ambulatory Visit: Payer: Self-pay | Admitting: Obstetrics and Gynecology

## 2015-07-25 DIAGNOSIS — F411 Generalized anxiety disorder: Secondary | ICD-10-CM

## 2015-07-25 MED ORDER — LISDEXAMFETAMINE DIMESYLATE 40 MG PO CAPS: 40.0000 mg | ORAL_CAPSULE | ORAL | Status: DC

## 2015-07-25 MED ORDER — ALPRAZOLAM 1 MG PO TABS: 1.0000 mg | ORAL_TABLET | Freq: Three times a day (TID) | ORAL | Status: DC | PRN

## 2015-07-25 NOTE — Telephone Encounter (Signed)
done

## 2015-07-26 ENCOUNTER — Telehealth: Payer: Self-pay | Admitting: Obstetrics and Gynecology

## 2015-07-26 NOTE — Telephone Encounter (Signed)
Will need to see me

## 2015-07-26 NOTE — Telephone Encounter (Signed)
pls advise

## 2015-07-26 NOTE — Telephone Encounter (Signed)
Patient called requesting a refill on phentermine. She spoke with melody about it in January per patient. She also sated she needs a b12 injection. Does she need to see Melody or a nurse visit?

## 2015-08-01 NOTE — Telephone Encounter (Signed)
Sarah Lawrence would you call this pt and just make her appt for follow-up weight management, thanks No rush

## 2015-09-08 ENCOUNTER — Ambulatory Visit: Payer: Self-pay | Admitting: Obstetrics and Gynecology

## 2015-10-30 ENCOUNTER — Telehealth: Payer: Self-pay | Admitting: Obstetrics and Gynecology

## 2015-10-30 NOTE — Telephone Encounter (Signed)
Pt needs refill on her add medication

## 2015-10-30 NOTE — Telephone Encounter (Signed)
See refill

## 2015-11-02 ENCOUNTER — Other Ambulatory Visit: Payer: Self-pay | Admitting: Obstetrics and Gynecology

## 2015-11-02 MED ORDER — LISDEXAMFETAMINE DIMESYLATE 40 MG PO CAPS: 40.0000 mg | ORAL_CAPSULE | ORAL | Status: DC

## 2015-11-20 ENCOUNTER — Other Ambulatory Visit: Payer: Self-pay | Admitting: Obstetrics and Gynecology

## 2015-11-21 ENCOUNTER — Other Ambulatory Visit: Payer: Self-pay | Admitting: *Deleted

## 2015-11-21 ENCOUNTER — Telehealth: Payer: Self-pay | Admitting: Obstetrics and Gynecology

## 2015-11-21 MED ORDER — ACYCLOVIR 400 MG PO TABS
400.0000 mg | ORAL_TABLET | Freq: Three times a day (TID) | ORAL | 4 refills | Status: DC
Start: 2015-11-21 — End: 2016-12-02

## 2015-11-21 NOTE — Telephone Encounter (Signed)
Pt called and she stated she asked pharmacy to send in refill and she stated pharmacy said we didn't authorize the refill for her acyclovir, pt is requesting a refill for RX.

## 2015-11-27 ENCOUNTER — Telehealth: Payer: Self-pay | Admitting: Obstetrics and Gynecology

## 2015-11-27 ENCOUNTER — Other Ambulatory Visit: Payer: Managed Care, Other (non HMO)

## 2015-11-27 ENCOUNTER — Other Ambulatory Visit: Payer: Self-pay | Admitting: *Deleted

## 2015-11-27 DIAGNOSIS — R309 Painful micturition, unspecified: Secondary | ICD-10-CM

## 2015-11-27 MED ORDER — METRONIDAZOLE 500 MG PO TABS: 500.0000 mg | ORAL_TABLET | Freq: Two times a day (BID) | ORAL | 0 refills | Status: DC

## 2015-11-27 NOTE — Telephone Encounter (Signed)
PT KNOWS THERE ARE NO PROVIDERS IN THE OFFICE TODAY BUT SHE THINKS SHE HAS A UTI, SHE DIDN'T KNOW IF SHE NEEDED TO COME IN AND DROP A URINE SAMPLE OFF OR WAHT. PT WOULD LIKE A CALL BACK ON WHAT SHE CAN DO.

## 2015-11-27 NOTE — Telephone Encounter (Signed)
Notified pt she can drop off UA and I will check it, left detailed message

## 2015-11-28 LAB — URINE CULTURE

## 2015-11-29 ENCOUNTER — Ambulatory Visit (INDEPENDENT_AMBULATORY_CARE_PROVIDER_SITE_OTHER): Payer: Managed Care, Other (non HMO) | Admitting: Obstetrics and Gynecology

## 2015-11-29 VITALS — BP 112/69 | HR 81 | Ht 65.0 in | Wt 238.7 lb

## 2015-11-29 DIAGNOSIS — E669 Obesity, unspecified: Secondary | ICD-10-CM

## 2015-11-29 DIAGNOSIS — B373 Candidiasis of vulva and vagina: Secondary | ICD-10-CM

## 2015-11-29 DIAGNOSIS — B3731 Acute candidiasis of vulva and vagina: Secondary | ICD-10-CM

## 2015-11-29 MED ORDER — PHENTERMINE HCL 37.5 MG PO TABS: 37.5000 mg | ORAL_TABLET | Freq: Every day | ORAL | 2 refills | Status: DC

## 2015-11-29 MED ORDER — TERCONAZOLE 0.4 % VA CREA: 1.0000 | TOPICAL_CREAM | Freq: Every day | VAGINAL | 0 refills | Status: DC

## 2015-11-29 NOTE — Patient Instructions (Signed)

## 2015-11-29 NOTE — Progress Notes (Signed)
Subjective:     Patient ID: Sarah Lawrence, female   DOB: 10/07/1985, 30 y.o.   MRN: [id number]  HPI Here for follow-up weight visit, and reports vaginal itching with increased discharge x 3 days, used OTC monistat with no relief.   Review of Systems Vaginal itching and vaginal yellow discharge x 3 days.    Objective:   Physical Exam A&O x4 Blood pressure 112/69, pulse 81, height 5\' 5"  (1.651 m), weight 238 lb 11.2 oz (108.3 kg), last menstrual period 11/09/2015, not currently breastfeeding. Pelvic exam: VULVA: normal appearing vulva with no masses, tenderness or lesions, vulvar erythema externally, VAGINA: normal appearing vagina with normal color and discharge, no lesions, vaginal erythema throughout with increase white d/c , WET MOUNT done - results: negative for pathogens, normal epithelial cells, lactobacilli.    Assessment:     Obesity- routine visit Yeast infection     Plan:     Continue current weight loss meds, B12 given today RX for terazol 7 given and instructed on use and expected outcomes.  Brooklynn Brandenburg [place], CNM

## 2015-12-27 ENCOUNTER — Ambulatory Visit: Payer: Self-pay

## 2015-12-29 ENCOUNTER — Ambulatory Visit
Admission: EM | Admit: 2015-12-29 | Discharge: 2015-12-29 | Disposition: A | Discharge status: Home / Self Care | Payer: 59 | Attending: Emergency Medicine | Admitting: Emergency Medicine

## 2015-12-29 ENCOUNTER — Encounter: Payer: Self-pay | Admitting: Emergency Medicine

## 2015-12-29 DIAGNOSIS — R310 Gross hematuria: Secondary | ICD-10-CM | POA: Diagnosis not present

## 2015-12-29 DIAGNOSIS — N390 Urinary tract infection, site not specified: Secondary | ICD-10-CM | POA: Diagnosis not present

## 2015-12-29 LAB — URINALYSIS COMPLETE WITH MICROSCOPIC (ARMC ONLY)
Bilirubin Urine: NEGATIVE
Glucose, UA: NEGATIVE mg/dL
Ketones, ur: NEGATIVE mg/dL
Nitrite: NEGATIVE
Protein, ur: NEGATIVE mg/dL
Specific Gravity, Urine: 1.01 (ref 1.005–1.030)
pH: 7 (ref 5.0–8.0)

## 2015-12-29 MED ORDER — SULFAMETHOXAZOLE-TRIMETHOPRIM 800-160 MG PO TABS: 1.0000 | ORAL_TABLET | Freq: Two times a day (BID) | ORAL | 0 refills | Status: DC

## 2015-12-29 MED ORDER — PHENAZOPYRIDINE HCL 200 MG PO TABS: 200.0000 mg | ORAL_TABLET | Freq: Three times a day (TID) | ORAL | 0 refills | Status: DC
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
## 2015-12-29 NOTE — ED Triage Notes (Signed)
Patient c/o burning when urinating and urinary urgency since yesterday.

## 2015-12-31 LAB — URINE CULTURE: Culture: <10000 — AB

## 2016-01-01 ENCOUNTER — Other Ambulatory Visit: Payer: Self-pay | Admitting: *Deleted

## 2016-01-01 ENCOUNTER — Telehealth: Payer: Self-pay | Admitting: Obstetrics and Gynecology

## 2016-01-01 MED ORDER — TERCONAZOLE 0.4 % VA CREA: 1.0000 | TOPICAL_CREAM | Freq: Every day | VAGINAL | 1 refills | Status: DC

## 2016-01-01 NOTE — Telephone Encounter (Signed)
Done-ac 

## 2016-01-01 NOTE — Telephone Encounter (Signed)
Pt called and she has a UTI and she is taking Septra and now has developed a yeast infection due to the antibiotic , she wanted to know if [facility] or you could send in the cream that [facility] prescribed last time she had one, she said it started with a T. And she uses [facility].

## 2016-01-02 ENCOUNTER — Telehealth: Payer: Self-pay | Admitting: *Deleted

## 2016-01-02 NOTE — Telephone Encounter (Signed)
Urine culture insignificant growth unable to perform drug sensitivities.  If still symptomatic after completing bactrim or if worsening recommend re-evaluation/repeat urinalysis.  Left message for patient to contact clinic if further questions or concerns.

## 2016-01-23 ENCOUNTER — Telehealth: Payer: Self-pay | Admitting: Obstetrics and Gynecology

## 2016-01-23 NOTE — Telephone Encounter (Signed)
PT CALLED AD WANTED TO SEE IF [facility] COULD LET HERGET BACK ON PROZAC, SHE HAS BEEN OFF OF IT FOR A WHILE BUT WANTS TO GET BACK ON IT AND WASN'T SURE IF SHE NEEDED TO BE SEEN IN ORDER TO GET BACK ON IT OR IF [facility] COULD REFILL IT FOR HER.

## 2016-01-23 NOTE — Telephone Encounter (Signed)
pls advise

## 2016-01-24 ENCOUNTER — Other Ambulatory Visit: Payer: Self-pay | Admitting: Obstetrics and Gynecology

## 2016-01-24 MED ORDER — FLUOXETINE HCL 20 MG PO CAPS: 20.0000 mg | ORAL_CAPSULE | Freq: Every day | ORAL | 1 refills | Status: DC

## 2016-01-24 NOTE — Telephone Encounter (Signed)
Sent in rx, will discuss more at AE scheduled next month, but she can go ahead and start it

## 2016-01-24 NOTE — Telephone Encounter (Signed)
Left detailed message for pt 

## 2016-01-31 ENCOUNTER — Telehealth: Payer: Self-pay | Admitting: Obstetrics and Gynecology

## 2016-01-31 ENCOUNTER — Other Ambulatory Visit: Payer: Self-pay | Admitting: Obstetrics and Gynecology

## 2016-01-31 MED ORDER — LISDEXAMFETAMINE DIMESYLATE 40 MG PO CAPS: 40.0000 mg | ORAL_CAPSULE | ORAL | 0 refills | Status: DC

## 2016-01-31 NOTE — Telephone Encounter (Signed)
Pt needs her ADD medication refilled (Vyvanse)

## 2016-02-01 ENCOUNTER — Telehealth: Payer: Self-pay | Admitting: Obstetrics and Gynecology

## 2016-02-01 NOTE — Telephone Encounter (Signed)
Pt was wanting to see if you could put all of her AE labs in for nov 2, her AE appt is that day but not until 4, so she didn't want to go all day without eating she was wanting to come in that morning and have them done before she had to be at work at 8:30, she said would like all the labs.

## 2016-02-02 ENCOUNTER — Other Ambulatory Visit: Payer: Self-pay | Admitting: Obstetrics and Gynecology

## 2016-02-02 DIAGNOSIS — Z0000 Encounter for general adult medical examination without abnormal findings: Secondary | ICD-10-CM

## 2016-02-02 NOTE — Telephone Encounter (Signed)
Please let her know orders are in.

## 2016-02-05 NOTE — Telephone Encounter (Signed)
Left detailed message lab orders are in

## 2016-02-12 ENCOUNTER — Other Ambulatory Visit: Payer: 59

## 2016-02-12 DIAGNOSIS — Z0000 Encounter for general adult medical examination without abnormal findings: Secondary | ICD-10-CM

## 2016-02-13 LAB — CBC
HEMATOCRIT: 42.9 % (ref 34.0–46.6)
HEMOGLOBIN: 14.5 g/dL (ref 11.1–15.9)
MCH: 30.1 pg (ref 26.6–33.0)
MCHC: 33.8 g/dL (ref 31.5–35.7)
MCV: 89 fL (ref 79–97)
Platelets: 358 10*3/uL (ref 150–379)
RBC: 4.82 x10E6/uL (ref 3.77–5.28)
RDW: 13.3 % (ref 12.3–15.4)
WBC: 7.7 10*3/uL (ref 3.4–10.8)

## 2016-02-13 LAB — COMPREHENSIVE METABOLIC PANEL
ALBUMIN: 4 g/dL (ref 3.5–5.5)
ALK PHOS: 117 IU/L (ref 39–117)
ALT: 17 IU/L (ref 0–32)
AST: 13 IU/L (ref 0–40)
Albumin/Globulin Ratio: 1.4 (ref 1.2–2.2)
BUN/Creatinine Ratio: 15 (ref 9–23)
BUN: 14 mg/dL (ref 6–20)
Bilirubin Total: 0.5 mg/dL (ref 0.0–1.2)
CALCIUM: 9.4 mg/dL (ref 8.7–10.2)
CO2: 19 mmol/L (ref 18–29)
CREATININE: 0.96 mg/dL (ref 0.57–1.00)
Chloride: 104 mmol/L (ref 96–106)
GFR calc Af Amer: 92 mL/min/{1.73_m2} (ref >59)
GFR, EST NON AFRICAN AMERICAN: 80 mL/min/{1.73_m2} (ref >59)
GLOBULIN, TOTAL: 2.8 g/dL (ref 1.5–4.5)
GLUCOSE: 93 mg/dL (ref 65–99)
Potassium: 4.4 mmol/L (ref 3.5–5.2)
SODIUM: 140 mmol/L (ref 134–144)
Total Protein: 6.8 g/dL (ref 6.0–8.5)

## 2016-02-13 LAB — THYROID PANEL WITH TSH
Free Thyroxine Index: 2 (ref 1.2–4.9)
T3 UPTAKE RATIO: 22 % — AB (ref 24–39)
T4, Total: 9.3 ug/dL (ref 4.5–12.0)
TSH: 1.59 u[IU]/mL (ref 0.450–4.500)

## 2016-02-13 LAB — LIPID PANEL
Chol/HDL Ratio: 4.6 ratio units — ABNORMAL HIGH (ref 0.0–4.4)
Cholesterol, Total: 153 mg/dL (ref 100–199)
HDL: 33 mg/dL — ABNORMAL LOW (ref >39)
LDL CALC: 93 mg/dL (ref 0–99)
Triglycerides: 136 mg/dL (ref 0–149)
VLDL CHOLESTEROL CAL: 27 mg/dL (ref 5–40)

## 2016-02-13 LAB — HEMOGLOBIN A1C
ESTIMATED AVERAGE GLUCOSE: 100 mg/dL
HEMOGLOBIN A1C: 5.1 % (ref 4.8–5.6)

## 2016-02-15 ENCOUNTER — Ambulatory Visit (INDEPENDENT_AMBULATORY_CARE_PROVIDER_SITE_OTHER): Payer: 59 | Admitting: Obstetrics and Gynecology

## 2016-02-15 ENCOUNTER — Encounter: Payer: Self-pay | Admitting: Obstetrics and Gynecology

## 2016-02-15 VITALS — BP 127/78 | HR 74 | Ht 65.0 in | Wt 228.0 lb

## 2016-02-15 DIAGNOSIS — L989 Disorder of the skin and subcutaneous tissue, unspecified: Secondary | ICD-10-CM | POA: Diagnosis not present

## 2016-02-15 DIAGNOSIS — Z01419 Encounter for gynecological examination (general) (routine) without abnormal findings: Secondary | ICD-10-CM

## 2016-02-15 DIAGNOSIS — E669 Obesity, unspecified: Secondary | ICD-10-CM | POA: Diagnosis not present

## 2016-02-15 DIAGNOSIS — F419 Anxiety disorder, unspecified: Secondary | ICD-10-CM | POA: Diagnosis not present

## 2016-02-15 MED ORDER — ALPRAZOLAM 1 MG PO TABS: 1.0000 mg | ORAL_TABLET | Freq: Three times a day (TID) | ORAL | 1 refills | Status: DC | PRN

## 2016-02-15 NOTE — Patient Instructions (Signed)
  Place annual gynecologic exam patient instructions here.  Thank you for enrolling in [facility]. Please follow the instructions below to securely access your online medical record. MyChart allows you to send messages to your doctor, view your test results, manage appointments, and more.   How Do I Sign Up? 1. In your Internet browser, go to [employer] and enter [email or web address]. 2. Click on the Sign Up Now link in the Sign In box. You will see the New Member Sign Up page. 3. Enter your MyChart Access Code exactly as it appears below. You will not need to use this code after you've completed the sign-up process. If you do not sign up before the expiration date, you must request a new code.  MyChart Access Code: [id number] Expires: 02/27/2016  6:35 PM  4. Enter your Social Security Number ([id number]) and Date of Birth (mm/dd/yyyy) as indicated and click Submit. You will be taken to the next sign-up page. 5. Create a MyChart ID. This will be your MyChart login ID and cannot be changed, so think of one that is secure and easy to remember. 6. Create a MyChart password. You can change your password at any time. 7. Enter your Password Reset Question and Answer. This can be used at a later time if you forget your password.  8. Enter your e-mail address. You will receive e-mail notification when new information is available in [facility]. 9. Click Sign Up. You can now view your medical record.   Additional Information Remember, MyChart is NOT to be used for urgent needs. For medical emergencies, dial 911.

## 2016-02-15 NOTE — Progress Notes (Signed)
Subjective:     Sarah Lawrence is a 30 y.o. female and is here for a comprehensive physical exam. The patient reports problems - "lump" in right upper leg, has had for a couple of years however it seems to be growin. Also wants to restart Xanax..States that she has been having a lot of anxiety about her children and is waking up several times a night to check on them.   2x2cm cord like mass, soft and moveable to left upper leg just below groin. Painless and patient states it does not bother her other than knowing it is there.    Social History   Social History  . Marital status: Married    Spouse name: N/A  . Number of children: N/A  . Years of education: N/A   Occupational History  . Not on file.   Social History Main Topics  . Smoking status: Never Smoker  . Smokeless tobacco: Never Used  . Alcohol use 0.6 oz/week    1 Glasses of wine per week  . Drug use: No  . Sexual activity: Yes    Birth control/ protection: Pill   Other Topics Concern  . Not on file   Social History Narrative  . No narrative on file   Health Maintenance  Topic Date Due  . TETANUS/TDAP  05/05/2005  . INFLUENZA VACCINE  11/14/2015  . PAP SMEAR  05/03/2018  . HIV Screening  Completed    The following portions of the patient's history were reviewed and updated as appropriate: allergies, current medications, past family history, past medical history, past social history, past surgical history and problem list.  Review of Systems Pertinent items are noted in HPI.   Objective:    BP 127/78   Pulse 74   Ht 5\' 5"  (1.651 m)   Wt 228 lb (103.4 kg)   LMP 02/01/2016   BMI 37.94 kg/m  General appearance: alert, cooperative and appears stated age Head: Normocephalic, without obvious abnormality, atraumatic Throat: lips, mucosa, and tongue normal; teeth and gums normal Neck: no adenopathy, no carotid bruit, no JVD, supple, symmetrical, trachea midline and thyroid not enlarged, symmetric, no  tenderness/mass/nodules Back: symmetric, no curvature. ROM normal. No CVA tenderness. Lungs: clear to auscultation bilaterally Breasts: normal appearance, no masses or tenderness Heart: regular rate and rhythm, S1, S2 normal, no murmur, click, rub or gallop Abdomen: soft, non-tender; bowel sounds normal; no masses,  no organomegaly Pelvic: cervix normal in appearance, external genitalia normal, no adnexal masses or tenderness, no cervical motion tenderness, rectovaginal septum normal, uterus normal size, shape, and consistency and vagina normal without discharge Pap-smear not indicated at this time. Extremities: extremities normal, atraumatic, no cyanosis or edema Pulses: 2+ and symmetric Skin: Skin color, texture, turgor normal. No rashes or lesions, 2x2 cord-like mass noted to left upper leg.  Neurologic: Grossly normal .     Assessment:    Healthy female exam.  Anxiety OCP User  Leg mass     Plan:   Watchful waiting for leg mass- advised to follow up if it became painful. Prescription for PRN Xanax in addition to Prozac Husband schedule for vasectomy but will remain on pill until cleared.   See After Visit Summary for Counseling Recommendations

## 2016-03-12 ENCOUNTER — Telehealth: Payer: Self-pay | Admitting: Obstetrics and Gynecology

## 2016-03-12 NOTE — Telephone Encounter (Signed)
PT CALLED AND SHE WAS WANTING TO KNOW IF [facility] COULD INCREASE HER PROZAC, SHE WAS HERE FOR HER AE A FEW WEEKS AGO AND AT THAT TIME SHE FELT THAT IT WAS GOOD, BUT NOW SHE SAID ITS NOT, SHE IS AWARE THAT [facility] IS OUT OF THE OFFICE. SHE DIDN'T KNOW IF YOU COULD CALL HER AND MAYBE ADVISE HER IF SHE NEEDS TO TAKE 2 AND SEE IF THAT WILL WORK OR WHAT SHE COULD DO, SHE WOULD LIKE A CALL BACK, SHE SAID IF SHE DOESN'T ANSWER TO LEAVE A MESSAGE.

## 2016-03-12 NOTE — Telephone Encounter (Signed)
Left pt detailed message she could take 2 of her prozac, that would make it 40mg , to let me know before she runs out so I can call pharmacy and let them know

## 2016-03-14 ENCOUNTER — Ambulatory Visit: Payer: 59 | Admitting: Podiatry

## 2016-03-21 ENCOUNTER — Telehealth: Payer: Self-pay | Admitting: Obstetrics and Gynecology

## 2016-03-21 ENCOUNTER — Other Ambulatory Visit: Payer: Self-pay | Admitting: *Deleted

## 2016-03-21 MED ORDER — FLUOXETINE HCL 40 MG PO CAPS: ORAL_CAPSULE | ORAL | 6 refills | Status: DC

## 2016-03-21 NOTE — Telephone Encounter (Signed)
She is out of refills on her prozac, she needs refill. She has been taking 2 aday instead of one per Melody. So Instead of 30 she needs 60 quantity.  [facility]

## 2016-03-21 NOTE — Telephone Encounter (Signed)
Done-ac 

## 2016-04-10 ENCOUNTER — Other Ambulatory Visit: Payer: Self-pay | Admitting: Obstetrics and Gynecology

## 2016-04-10 ENCOUNTER — Telehealth: Payer: Self-pay | Admitting: Obstetrics and Gynecology

## 2016-04-10 MED ORDER — LORAZEPAM 1 MG PO TABS: 1.0000 mg | ORAL_TABLET | Freq: Every day | ORAL | 1 refills | Status: DC

## 2016-04-10 NOTE — Telephone Encounter (Signed)
She is already on the highest recommended dose of each, so I would feel safer trying an additional medication for the anxiety at bedtime, Ativan- I want her to take ne pill nightly and keep taking prozac daily with xanax up to two times a day (but no xanax at bedtime).

## 2016-04-10 NOTE — Telephone Encounter (Signed)
pls advise

## 2016-04-10 NOTE — Telephone Encounter (Signed)
Left pt detailed message.
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
## 2016-04-16 ENCOUNTER — Telehealth: Payer: Self-pay | Admitting: Obstetrics and Gynecology

## 2016-04-16 NOTE — Telephone Encounter (Signed)
Pt called and she needs a refill on the alprazolam. [facility] uses [facility].

## 2016-04-16 NOTE — Telephone Encounter (Signed)
pls advise

## 2016-04-17 ENCOUNTER — Other Ambulatory Visit: Payer: Self-pay | Admitting: Obstetrics and Gynecology

## 2016-04-17 ENCOUNTER — Telehealth: Payer: Self-pay | Admitting: Obstetrics and Gynecology

## 2016-04-17 NOTE — Telephone Encounter (Signed)
PT STATED SHE NEEDS A REFILL ON HER VYVANSE, BUT SHE DOES NOT NEED IT UNTIL 1/21, SHE JUST WANTED TO GO AHEAD A ND LET us KNOW.

## 2016-04-17 NOTE — Telephone Encounter (Signed)
See refill request.

## 2016-04-17 NOTE — Telephone Encounter (Signed)
pls advise

## 2016-04-19 ENCOUNTER — Ambulatory Visit: Payer: 59 | Admitting: Podiatry

## 2016-05-27 ENCOUNTER — Telehealth: Payer: Self-pay | Admitting: Obstetrics and Gynecology

## 2016-05-27 NOTE — Telephone Encounter (Signed)
PT ALSO NEEDS A REFILL ON THE LOPRAZALAM, SHE LIKES THAT BETTER THAN THE XANAX, SHE SAID THE XANAX MAKES HER FORGET THINGS.

## 2016-05-27 NOTE — Telephone Encounter (Signed)
PT WAS CALLING AND BEING PRO ACTIVE, SHE WAS WANTING A REFILL ON HER VYVANSE. 40MG  BUT SHE WANTED TO SEE IF YOU WOULD WRITE IT FOR A 3 MONTH SUPPLY.

## 2016-05-28 ENCOUNTER — Other Ambulatory Visit: Payer: Self-pay | Admitting: Obstetrics and Gynecology

## 2016-05-28 MED ORDER — LORAZEPAM 1 MG PO TABS: 1.0000 mg | ORAL_TABLET | Freq: Every day | ORAL | 1 refills | Status: DC

## 2016-05-28 MED ORDER — LISDEXAMFETAMINE DIMESYLATE 40 MG PO CAPS: 40.0000 mg | ORAL_CAPSULE | ORAL | 0 refills | Status: DC

## 2016-05-28 NOTE — Telephone Encounter (Signed)
Please let her know they are ready for pickup 

## 2016-05-28 NOTE — Telephone Encounter (Signed)
Notified pt. 

## 2016-06-06 ENCOUNTER — Ambulatory Visit: Payer: 59 | Admitting: Podiatry

## 2016-06-17 ENCOUNTER — Encounter: Payer: Self-pay | Admitting: Obstetrics and Gynecology

## 2016-06-26 ENCOUNTER — Other Ambulatory Visit: Payer: Self-pay | Admitting: *Deleted

## 2016-06-26 ENCOUNTER — Encounter: Payer: Self-pay | Admitting: Obstetrics and Gynecology

## 2016-06-26 DIAGNOSIS — G44229 Chronic tension-type headache, not intractable: Secondary | ICD-10-CM | POA: Diagnosis not present

## 2016-06-26 DIAGNOSIS — G540 Brachial plexus disorders: Secondary | ICD-10-CM | POA: Diagnosis not present

## 2016-06-26 DIAGNOSIS — M9901 Segmental and somatic dysfunction of cervical region: Secondary | ICD-10-CM | POA: Diagnosis not present

## 2016-06-26 MED ORDER — LORAZEPAM 1 MG PO TABS: 1.0000 mg | ORAL_TABLET | Freq: Every day | ORAL | 1 refills | Status: DC

## 2016-07-01 DIAGNOSIS — G540 Brachial plexus disorders: Secondary | ICD-10-CM | POA: Diagnosis not present

## 2016-07-01 DIAGNOSIS — M9901 Segmental and somatic dysfunction of cervical region: Secondary | ICD-10-CM | POA: Diagnosis not present

## 2016-07-01 DIAGNOSIS — G44229 Chronic tension-type headache, not intractable: Secondary | ICD-10-CM | POA: Diagnosis not present

## 2016-07-02 ENCOUNTER — Other Ambulatory Visit: Payer: Self-pay | Admitting: Obstetrics and Gynecology

## 2016-07-02 MED ORDER — ALPRAZOLAM 0.5 MG PO TABS: 0.5000 mg | ORAL_TABLET | Freq: Every evening | ORAL | 0 refills | Status: DC | PRN

## 2016-07-04 DIAGNOSIS — M9901 Segmental and somatic dysfunction of cervical region: Secondary | ICD-10-CM | POA: Diagnosis not present

## 2016-07-04 DIAGNOSIS — G44229 Chronic tension-type headache, not intractable: Secondary | ICD-10-CM | POA: Diagnosis not present

## 2016-07-04 DIAGNOSIS — G540 Brachial plexus disorders: Secondary | ICD-10-CM | POA: Diagnosis not present

## 2016-07-17 ENCOUNTER — Encounter: Payer: Self-pay | Admitting: Obstetrics and Gynecology

## 2016-07-18 ENCOUNTER — Ambulatory Visit (INDEPENDENT_AMBULATORY_CARE_PROVIDER_SITE_OTHER): Payer: 59 | Admitting: Certified Nurse Midwife

## 2016-07-18 ENCOUNTER — Ambulatory Visit: Payer: Self-pay | Admitting: Obstetrics and Gynecology

## 2016-07-18 ENCOUNTER — Encounter: Payer: Self-pay | Admitting: Certified Nurse Midwife

## 2016-07-18 VITALS — BP 139/83 | HR 88 | Wt 234.7 lb

## 2016-07-18 DIAGNOSIS — Z30014 Encounter for initial prescription of intrauterine contraceptive device: Secondary | ICD-10-CM

## 2016-07-18 MED ORDER — LEVONORGESTREL 20 MCG/24HR IU IUD: 1.0000 | INTRAUTERINE_SYSTEM | Freq: Once | INTRAUTERINE | 0 refills | Status: DC

## 2016-07-18 NOTE — Patient Instructions (Addendum)
IUD PLACEMENT POST-PROCEDURE INSTRUCTIONS  1. You may take Ibuprofen, Aleve or Tylenol for pain if needed.  Cramping should resolve within in 24 hours.  2. You may have a small amount of spotting.  You should wear a mini pad for the next few days.  3. You may have intercourse after 24 hours.  If you using this for birth control, it is effective immediately.  4. You need to call if you have any pelvic pain, fever, heavy bleeding or foul smelling vaginal discharge.  Irregular bleeding is common the first several months after having an IUD placed. You do not need to call for this reason unless you are concerned.  5. Shower or bathe as normal  You should have a follow-up appointment in 4-8 weeks for a re-check to make sure you are not having any problems.  Intrauterine Device Insertion, Care After This sheet gives you information about how to care for yourself after your procedure. Your health care provider may also give you more specific instructions. If you have problems or questions, contact your health care provider. What can I expect after the procedure? After the procedure, it is common to have:  Cramps and pain in the abdomen.  Light bleeding (spotting) or heavier bleeding that is like your menstrual period. This may last for up to a few days.  Lower back pain.  Dizziness.  Headaches.  Nausea. Follow these instructions at home:  Before resuming sexual activity, check to make sure that you can feel the IUD string(s). You should be able to feel the end of the string(s) below the opening of your cervix. If your IUD string is in place, you may resume sexual activity.  If you had a hormonal IUD inserted more than 7 days after your most recent period started, you will need to use a backup method of birth control for 7 days after IUD insertion. Ask your health care provider whether this applies to you.  Continue to check that the IUD is still in place by feeling for the string(s)  after every menstrual period, or once a month.  Take over-the-counter and prescription medicines only as told by your health care provider.  Do not drive or use heavy machinery while taking prescription pain medicine.  Keep all follow-up visits as told by your health care provider. This is important. Contact a health care provider if:  You have bleeding that is heavier or lasts longer than a normal menstrual cycle.  You have a fever.  You have cramps or abdominal pain that get worse or do not get better with medicine.  You develop abdominal pain that is new or is not in the same area of earlier cramping and pain.  You feel lightheaded or weak.  You have abnormal or bad-smelling discharge from your vagina.  You have pain during sexual activity.  You have any of the following problems with your IUD string(s):  The string bothers or hurts you or your sexual partner.  You cannot feel the string.  The string has gotten longer.  You can feel the IUD in your vagina.  You think you may be pregnant, or you miss your menstrual period.  You think you may have an STI (sexually transmitted infection). Get help right away if:  You have flu-like symptoms.  You have a fever and chills.  You can feel that your IUD has slipped out of place. Summary  After the procedure, it is common to have cramps and pain in the  abdomen. It is also common to have light bleeding (spotting) or heavier bleeding that is like your menstrual period.  Continue to check that the IUD is still in place by feeling for the string(s) after every menstrual period, or once a month.  Keep all follow-up visits as told by your health care provider. This is important.  Contact your health care provider if you have problems with your IUD string(s), such as the string getting longer or bothering you or your sexual partner. This information is not intended to replace advice given to you by your health care provider.  Make sure you discuss any questions you have with your health care provider. Document Released: 11/28/2010 Document Revised: 02/21/2016 Document Reviewed: 02/21/2016 Elsevier Interactive Patient Education  2017 [employer].

## 2016-07-18 NOTE — Progress Notes (Signed)
Sarah Lawrence is a 31 y.o. year old G25P0 Caucasian female who presents for placement of a Mirena IUD.  Patient's last menstrual period was 07/17/2016 (exact date). BP 139/83   Pulse 88   Wt 234 lb 11.2 oz (106.5 kg)   LMP 07/17/2016 (Exact Date)   BMI 39.06 kg/m    The risks and benefits of the method and placement have been thouroughly reviewed with the patient and all questions were answered.  Specifically the patient is aware of failure rate of 04/998, expulsion of the IUD and of possible perforation.  The patient is aware of irregular bleeding due to the method and understands the incidence of irregular bleeding diminishes with time.  Signed copy of informed consent in chart.   Time out was performed.  A medium plastic speculum was placed in the vagina.  The cervix was visualized, prepped using Betadine, and grasped with a single tooth tenaculum. The uterus was found to be retroflexed and it sounded to 6 cm.  Mirena IUD placed per manufacturer's recommendations.   The strings were trimmed to 3 cm.  Sonogram was performed and the proper placement of the IUD was verified via transvaginal u/s.   The patient was given post procedure instructions, including signs and symptoms of infection and to check for the strings after each menses or each month, and refraining from intercourse or anything in the vagina for 3 days.  She was given a Mirena care card with date Mirena placed, and date Mirena to be removed.  Reviewed red flag symptoms and when to call including PAINS acronym  RTC x 6 weeks for IUD string check or sooner if needed   Diona Fanti, CNM

## 2016-07-22 ENCOUNTER — Encounter: Payer: Self-pay | Admitting: Obstetrics and Gynecology

## 2016-08-28 ENCOUNTER — Encounter: Payer: Self-pay | Admitting: Obstetrics and Gynecology

## 2016-08-28 ENCOUNTER — Ambulatory Visit (INDEPENDENT_AMBULATORY_CARE_PROVIDER_SITE_OTHER): Payer: 59 | Admitting: Obstetrics and Gynecology

## 2016-08-28 VITALS — BP 109/74 | HR 93 | Ht 65.0 in | Wt 239.0 lb

## 2016-08-28 DIAGNOSIS — Z79899 Other long term (current) drug therapy: Secondary | ICD-10-CM | POA: Diagnosis not present

## 2016-08-28 DIAGNOSIS — Z30431 Encounter for routine checking of intrauterine contraceptive device: Secondary | ICD-10-CM

## 2016-08-28 MED ORDER — FLUOXETINE HCL 40 MG PO CAPS: ORAL_CAPSULE | ORAL | 6 refills | Status: DC

## 2016-08-28 MED ORDER — LISDEXAMFETAMINE DIMESYLATE 50 MG PO CAPS: 50.0000 mg | ORAL_CAPSULE | ORAL | 0 refills | Status: DC

## 2016-08-28 NOTE — Progress Notes (Signed)
Subjective:     Patient ID: Sarah Lawrence, female   DOB: 1985-11-03, 31 y.o.   MRN: [id number]  HPI Had  IUD placed 2 months ago and here for string check. Reports one menses since insertion. Happy with IUD and desires continuing use.   Also needs VyVance refilled but thinks the dose needs to be increased.states she feels like it wears off around 2 o clock each day.  Review of Systems negative    Objective:   Physical Exam A&O x4 Well groomed female in no distress Blood pressure 109/74, pulse 93, height 5\' 5"  (1.651 m), weight 239 lb (108.4 kg), last menstrual period 08/08/2016. Pelvic exam: normal external genitalia, vulva, vagina, cervix, uterus and adnexa, IUD string noted just inside the cervix.    Assessment:     IUD check ADD medication management    Plan:     Increased vyvance to 50mg  daily. Will call me in 1 month and let me know how it is working. RTC as needed.  Yardley Beltran Medford, CNM

## 2016-08-29 ENCOUNTER — Encounter: Payer: Self-pay | Admitting: Obstetrics and Gynecology

## 2016-08-30 ENCOUNTER — Encounter: Payer: Self-pay | Admitting: Obstetrics and Gynecology

## 2016-09-12 ENCOUNTER — Encounter: Payer: Self-pay | Admitting: Obstetrics and Gynecology

## 2016-09-18 ENCOUNTER — Encounter: Payer: Self-pay | Admitting: Obstetrics and Gynecology

## 2016-09-25 ENCOUNTER — Encounter: Payer: Self-pay | Admitting: Obstetrics and Gynecology

## 2016-09-25 ENCOUNTER — Other Ambulatory Visit: Payer: Self-pay | Admitting: *Deleted

## 2016-09-25 MED ORDER — LISDEXAMFETAMINE DIMESYLATE 50 MG PO CAPS: 50.0000 mg | ORAL_CAPSULE | ORAL | 0 refills | Status: DC

## 2016-10-15 ENCOUNTER — Other Ambulatory Visit: Payer: Self-pay | Admitting: Obstetrics and Gynecology

## 2016-10-15 ENCOUNTER — Encounter: Payer: Self-pay | Admitting: Obstetrics and Gynecology

## 2016-10-15 MED ORDER — LISDEXAMFETAMINE DIMESYLATE 60 MG PO CAPS: 60.0000 mg | ORAL_CAPSULE | ORAL | 0 refills | Status: DC
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
## 2016-10-22 ENCOUNTER — Encounter: Payer: Self-pay | Admitting: Obstetrics and Gynecology

## 2016-11-05 ENCOUNTER — Encounter: Payer: Self-pay | Admitting: Obstetrics and Gynecology

## 2016-11-07 ENCOUNTER — Other Ambulatory Visit: Payer: Self-pay | Admitting: Obstetrics and Gynecology

## 2016-11-07 MED ORDER — LISDEXAMFETAMINE DIMESYLATE 60 MG PO CAPS: 60.0000 mg | ORAL_CAPSULE | ORAL | 0 refills | Status: DC

## 2016-11-15 ENCOUNTER — Encounter: Payer: Self-pay | Admitting: Obstetrics and Gynecology

## 2016-11-15 ENCOUNTER — Telehealth: Payer: Self-pay | Admitting: Obstetrics and Gynecology

## 2016-11-15 ENCOUNTER — Other Ambulatory Visit (INDEPENDENT_AMBULATORY_CARE_PROVIDER_SITE_OTHER): Payer: 59

## 2016-11-15 DIAGNOSIS — M5440 Lumbago with sciatica, unspecified side: Secondary | ICD-10-CM | POA: Diagnosis not present

## 2016-11-15 LAB — POCT URINALYSIS DIPSTICK
Bilirubin, UA: NEGATIVE
Blood, UA: NEGATIVE
Glucose, UA: NEGATIVE
KETONES UA: NEGATIVE
LEUKOCYTES UA: NEGATIVE
NITRITE UA: NEGATIVE
PROTEIN UA: NEGATIVE
Spec Grav, UA: 1.01 (ref 1.010–1.025)
Urobilinogen, UA: 0.2 E.U./dL
pH, UA: 6 (ref 5.0–8.0)

## 2016-11-15 NOTE — Telephone Encounter (Signed)
Patient called and stated that she may have a UTI, I put her on the lab schedule to do a urine drop off. The patient is experiencing back pain and pain when urinating. The patient will be coming in at 1:30 pm on 11/15/2016. The patient did not disclose any other information. Please advise.

## 2016-11-15 NOTE — Telephone Encounter (Signed)
Pt came in dropped off ua, sent out for culture

## 2016-11-17 LAB — URINE CULTURE: Organism ID, Bacteria: NO GROWTH

## 2016-12-02 ENCOUNTER — Other Ambulatory Visit: Payer: Self-pay | Admitting: Obstetrics and Gynecology

## 2016-12-18 ENCOUNTER — Encounter: Payer: Self-pay | Admitting: Obstetrics and Gynecology

## 2016-12-23 ENCOUNTER — Encounter: Payer: Self-pay | Admitting: Obstetrics and Gynecology

## 2016-12-24 ENCOUNTER — Encounter: Payer: Self-pay | Admitting: Obstetrics and Gynecology

## 2016-12-24 ENCOUNTER — Ambulatory Visit (INDEPENDENT_AMBULATORY_CARE_PROVIDER_SITE_OTHER): Payer: 59 | Admitting: Obstetrics and Gynecology

## 2016-12-24 VITALS — BP 120/78 | HR 88 | Ht 65.0 in | Wt 251.9 lb

## 2016-12-24 DIAGNOSIS — Z538 Procedure and treatment not carried out for other reasons: Secondary | ICD-10-CM

## 2016-12-24 DIAGNOSIS — Z975 Presence of (intrauterine) contraceptive device: Secondary | ICD-10-CM

## 2016-12-24 MED ORDER — DESOGESTREL-ETHINYL ESTRADIOL 0.15-0.02/0.01 MG (21/5) PO TABS: 1.0000 | ORAL_TABLET | Freq: Every day | ORAL | 11 refills | Status: DC

## 2016-12-24 NOTE — Progress Notes (Signed)
Sarah Lawrence is a 31 y.o. year old G7P0 Caucasian female who presents for removal of a Mirena IUD. Her Mirena IUD was placed 06/2016.   Patient's last menstrual period was 12/10/2016. BP 120/78   Pulse 88   Ht 5\' 5"  (1.651 m)   Wt 251 lb 14.4 oz (114.3 kg)   LMP 12/10/2016   BMI 41.92 kg/m   Time out was performed.  A pederson speculum was placed in the vagina.  The cervix was visualized, and the strings were not visible.  Attempted retrieval with IUD device and unsuccessful mild spotting noted.    F/U tomorrow for pelvic ultrasound to check placement and attempt removal again. Will go ahead and start Mircette OCPs.  Melody Rockney Ghee, CNM

## 2016-12-25 ENCOUNTER — Encounter: Payer: Self-pay | Admitting: Obstetrics and Gynecology

## 2016-12-25 ENCOUNTER — Ambulatory Visit (INDEPENDENT_AMBULATORY_CARE_PROVIDER_SITE_OTHER): Payer: 59

## 2016-12-25 ENCOUNTER — Ambulatory Visit (INDEPENDENT_AMBULATORY_CARE_PROVIDER_SITE_OTHER): Payer: 59 | Admitting: Obstetrics and Gynecology

## 2016-12-25 ENCOUNTER — Ambulatory Visit: Payer: 59 | Admitting: Obstetrics and Gynecology

## 2016-12-25 VITALS — BP 120/78 | HR 88 | Ht 65.0 in | Wt 251.0 lb

## 2016-12-25 VITALS — BP 120/78 | HR 88 | Wt 251.0 lb

## 2016-12-25 DIAGNOSIS — Z975 Presence of (intrauterine) contraceptive device: Principal | ICD-10-CM

## 2016-12-25 DIAGNOSIS — Z538 Procedure and treatment not carried out for other reasons: Secondary | ICD-10-CM | POA: Diagnosis not present

## 2016-12-25 DIAGNOSIS — N946 Dysmenorrhea, unspecified: Secondary | ICD-10-CM

## 2016-12-25 DIAGNOSIS — Z01818 Encounter for other preprocedural examination: Secondary | ICD-10-CM

## 2016-12-25 DIAGNOSIS — Z98891 History of uterine scar from previous surgery: Secondary | ICD-10-CM

## 2016-12-25 DIAGNOSIS — Z8759 Personal history of other complications of pregnancy, childbirth and the puerperium: Secondary | ICD-10-CM

## 2016-12-25 DIAGNOSIS — R102 Pelvic and perineal pain: Secondary | ICD-10-CM

## 2016-12-25 DIAGNOSIS — Z302 Encounter for sterilization: Secondary | ICD-10-CM

## 2016-12-25 NOTE — Progress Notes (Signed)
Sarah Lawrence is a 31 y.o. year old G64P0 Caucasian female who presents for removal of a Mirena IUD.   Patient's last menstrual period was 12/10/2016. BP 120/78   Pulse 88   Wt 251 lb (113.9 kg)   LMP 12/10/2016   BMI 41.77 kg/m   Time out was performed.  Ultrasound Findings:  The uterus measures 8.8 x 4 x 4.4 cm. Echo texture is homogenous without evidence of focal masses.  The Endometrium measures 2.5 mm. There is fluid seen within the endometrium.  IUD is seen in the fundal endometrium.  Right Ovary is not seen. Left Ovary measures 2.7 x 1.8 x 2.1 cm. It is normal appearance. Survey of the adnexa demonstrates no adnexal masses. There is no free fluid in the cul de sac.  Impression: 1. There is fluid seen within the endometrium.  IUD is seen in the fundal endometrium.   A pederson speculum was placed in the vagina.  The cervix was visualized, and the strings were not visible. Attempted retrieval with IUD removal device. Not successful.  Desires going to OR for removal and doing BTL at same time. Minimal spotting noted.   Discussed ultrasound and IUD removal attempt with Dr Marisue Humble and he will assume care of this patient at this time.   Myria Steenbergen Rockney Ghee, CNM
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
## 2016-12-25 NOTE — Patient Instructions (Signed)
1. Return for postop appointment 10 days after surgery as scheduled
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
## 2016-12-26 ENCOUNTER — Encounter: Payer: Self-pay | Admitting: Obstetrics and Gynecology

## 2016-12-30 ENCOUNTER — Encounter: Payer: Self-pay | Admitting: Emergency Medicine

## 2016-12-30 ENCOUNTER — Emergency Department
Admission: EM | Admit: 2016-12-30 | Discharge: 2016-12-30 | Disposition: A | Discharge status: Home / Self Care | Payer: Commercial Managed Care - HMO | Source: Home / Self Care

## 2016-12-30 ENCOUNTER — Telehealth: Payer: Self-pay

## 2016-12-30 ENCOUNTER — Ambulatory Visit
Admission: RE | Admit: 2016-12-30 | Discharge: 2016-12-30 | Disposition: A | Discharge status: Home / Self Care | Payer: Commercial Managed Care - HMO | Source: Ambulatory Visit | Attending: Obstetrics and Gynecology | Admitting: Obstetrics and Gynecology

## 2016-12-30 ENCOUNTER — Ambulatory Visit: Payer: Commercial Managed Care - HMO | Admitting: Anesthesiology

## 2016-12-30 ENCOUNTER — Encounter
Admission: RE | Disposition: A | Discharge status: Home / Self Care | Payer: Self-pay | Source: Ambulatory Visit | Attending: Obstetrics and Gynecology

## 2016-12-30 DIAGNOSIS — Z85828 Personal history of other malignant neoplasm of skin: Secondary | ICD-10-CM | POA: Insufficient documentation

## 2016-12-30 DIAGNOSIS — F419 Anxiety disorder, unspecified: Secondary | ICD-10-CM | POA: Diagnosis not present

## 2016-12-30 DIAGNOSIS — T8389XA Other specified complication of genitourinary prosthetic devices, implants and grafts, initial encounter: Secondary | ICD-10-CM | POA: Diagnosis not present

## 2016-12-30 DIAGNOSIS — Z5321 Procedure and treatment not carried out due to patient leaving prior to being seen by health care provider: Secondary | ICD-10-CM | POA: Insufficient documentation

## 2016-12-30 DIAGNOSIS — Z8489 Family history of other specified conditions: Secondary | ICD-10-CM | POA: Diagnosis not present

## 2016-12-30 DIAGNOSIS — Z833 Family history of diabetes mellitus: Secondary | ICD-10-CM | POA: Insufficient documentation

## 2016-12-30 DIAGNOSIS — N801 Endometriosis of ovary: Secondary | ICD-10-CM | POA: Diagnosis not present

## 2016-12-30 DIAGNOSIS — X58XXXA Exposure to other specified factors, initial encounter: Secondary | ICD-10-CM | POA: Diagnosis not present

## 2016-12-30 DIAGNOSIS — Z9889 Other specified postprocedural states: Secondary | ICD-10-CM

## 2016-12-30 DIAGNOSIS — Z8249 Family history of ischemic heart disease and other diseases of the circulatory system: Secondary | ICD-10-CM | POA: Insufficient documentation

## 2016-12-30 DIAGNOSIS — R51 Headache: Secondary | ICD-10-CM | POA: Diagnosis not present

## 2016-12-30 DIAGNOSIS — N803 Endometriosis of pelvic peritoneum: Secondary | ICD-10-CM | POA: Diagnosis not present

## 2016-12-30 DIAGNOSIS — R102 Pelvic and perineal pain: Secondary | ICD-10-CM | POA: Diagnosis not present

## 2016-12-30 DIAGNOSIS — F909 Attention-deficit hyperactivity disorder, unspecified type: Secondary | ICD-10-CM | POA: Insufficient documentation

## 2016-12-30 DIAGNOSIS — Z302 Encounter for sterilization: Secondary | ICD-10-CM

## 2016-12-30 DIAGNOSIS — R300 Dysuria: Secondary | ICD-10-CM | POA: Insufficient documentation

## 2016-12-30 DIAGNOSIS — N946 Dysmenorrhea, unspecified: Secondary | ICD-10-CM

## 2016-12-30 DIAGNOSIS — F329 Major depressive disorder, single episode, unspecified: Secondary | ICD-10-CM | POA: Diagnosis not present

## 2016-12-30 HISTORY — PX: IUD REMOVAL: SHX5392

## 2016-12-30 HISTORY — PX: LAPAROSCOPIC BILATERAL SALPINGECTOMY: SHX5889

## 2016-12-30 HISTORY — PX: HYSTEROSCOPY: SHX211

## 2016-12-30 LAB — RAPID HIV SCREEN (HIV 1/2 AB+AG)
HIV 1/2 ANTIBODIES: NONREACTIVE
HIV-1 P24 ANTIGEN - HIV24: NONREACTIVE

## 2016-12-30 LAB — CBC WITH DIFFERENTIAL/PLATELET
BASOS PCT: 0 %
Basophils Absolute: 0 10*3/uL (ref 0–0.1)
EOS ABS: 0.2 10*3/uL (ref 0–0.7)
Eosinophils Relative: 2 %
HCT: 41.9 % (ref 35.0–47.0)
Hemoglobin: 14.6 g/dL (ref 12.0–16.0)
Lymphocytes Relative: 30 %
Lymphs Abs: 2.6 10*3/uL (ref 1.0–3.6)
MCH: 30.6 pg (ref 26.0–34.0)
MCHC: 34.9 g/dL (ref 32.0–36.0)
MCV: 87.6 fL (ref 80.0–100.0)
MONOS PCT: 6 %
Monocytes Absolute: 0.6 10*3/uL (ref 0.2–0.9)
Neutro Abs: 5.4 10*3/uL (ref 1.4–6.5)
Neutrophils Relative %: 62 %
Platelets: 363 10*3/uL (ref 150–440)
RBC: 4.78 MIL/uL (ref 3.80–5.20)
RDW: 13.4 % (ref 11.5–14.5)
WBC: 8.8 10*3/uL (ref 3.6–11.0)

## 2016-12-30 LAB — TYPE AND SCREEN
ABO/RH(D): A POS
Antibody Screen: NEGATIVE

## 2016-12-30 LAB — POCT PREGNANCY, URINE: PREG TEST UR: NEGATIVE

## 2016-12-30 SURGERY — REMOVAL, INTRAUTERINE DEVICE
Anesthesia: General | Site: Uterus | Wound class: Clean Contaminated

## 2016-12-30 MED ORDER — FENTANYL CITRATE (PF) 250 MCG/5ML IJ SOLN
INTRAMUSCULAR | Status: AC
  Filled 2016-12-30: qty 5

## 2016-12-30 MED ORDER — DEXAMETHASONE SODIUM PHOSPHATE 10 MG/ML IJ SOLN
INTRAMUSCULAR | Status: DC | PRN
  Administered 2016-12-30: 10 mg via INTRAVENOUS

## 2016-12-30 MED ORDER — LIDOCAINE HCL 2 % EX GEL
CUTANEOUS | Status: AC
  Filled 2016-12-30: qty 5

## 2016-12-30 MED ORDER — MIDAZOLAM HCL 2 MG/2ML IJ SOLN
INTRAMUSCULAR | Status: DC | PRN
  Administered 2016-12-30: 2 mg via INTRAVENOUS

## 2016-12-30 MED ORDER — ONDANSETRON HCL 4 MG/2ML IJ SOLN
INTRAMUSCULAR | Status: DC | PRN
  Administered 2016-12-30: 4 mg via INTRAVENOUS

## 2016-12-30 MED ORDER — FENTANYL CITRATE (PF) 100 MCG/2ML IJ SOLN
25.0000 ug | INTRAMUSCULAR | Status: AC | PRN
  Administered 2016-12-30 (×6): 25 ug via INTRAVENOUS

## 2016-12-30 MED ORDER — FENTANYL CITRATE (PF) 100 MCG/2ML IJ SOLN
INTRAMUSCULAR | Status: AC
  Filled 2016-12-30: qty 2

## 2016-12-30 MED ORDER — ROCURONIUM BROMIDE 100 MG/10ML IV SOLN
INTRAVENOUS | Status: DC | PRN
  Administered 2016-12-30: 20 mg via INTRAVENOUS
  Administered 2016-12-30 (×2): 10 mg via INTRAVENOUS
  Administered 2016-12-30: 40 mg via INTRAVENOUS

## 2016-12-30 MED ORDER — ROCURONIUM BROMIDE 50 MG/5ML IV SOLN
INTRAVENOUS | Status: AC
  Filled 2016-12-30: qty 1

## 2016-12-30 MED ORDER — PROPOFOL 10 MG/ML IV BOLUS
INTRAVENOUS | Status: AC
  Filled 2016-12-30: qty 20

## 2016-12-30 MED ORDER — PROPOFOL 10 MG/ML IV BOLUS
INTRAVENOUS | Status: DC | PRN
  Administered 2016-12-30: 150 mg via INTRAVENOUS
  Administered 2016-12-30: 50 mg via INTRAVENOUS

## 2016-12-30 MED ORDER — OXYCODONE-ACETAMINOPHEN 5-325 MG PO TABS
ORAL_TABLET | ORAL | Status: AC
  Filled 2016-12-30: qty 1

## 2016-12-30 MED ORDER — OXYCODONE-ACETAMINOPHEN 5-325 MG PO TABS
1.0000 | ORAL_TABLET | ORAL | Status: DC | PRN
  Administered 2016-12-30: 1 via ORAL

## 2016-12-30 MED ORDER — LACTATED RINGERS IV SOLN
INTRAVENOUS | Status: DC | PRN
  Administered 2016-12-30 (×2): via INTRAVENOUS

## 2016-12-30 MED ORDER — MIDAZOLAM HCL 2 MG/2ML IJ SOLN
INTRAMUSCULAR | Status: AC
  Filled 2016-12-30: qty 2

## 2016-12-30 MED ORDER — IBUPROFEN 200 MG PO TABS: 800.0000 mg | ORAL_TABLET | Freq: Three times a day (TID) | ORAL | 0 refills | Status: DC

## 2016-12-30 MED ORDER — FENTANYL CITRATE (PF) 100 MCG/2ML IJ SOLN
INTRAMUSCULAR | Status: AC
  Administered 2016-12-30: 25 ug via INTRAVENOUS
  Filled 2016-12-30: qty 2

## 2016-12-30 MED ORDER — FENTANYL CITRATE (PF) 100 MCG/2ML IJ SOLN
INTRAMUSCULAR | Status: DC | PRN
  Administered 2016-12-30: 50 ug via INTRAVENOUS
  Administered 2016-12-30: 25 ug via INTRAVENOUS
  Administered 2016-12-30: 100 ug via INTRAVENOUS
  Administered 2016-12-30: 25 ug via INTRAVENOUS

## 2016-12-30 MED ORDER — ACETAMINOPHEN 10 MG/ML IV SOLN
INTRAVENOUS | Status: DC | PRN
  Administered 2016-12-30: 1000 mg via INTRAVENOUS

## 2016-12-30 MED ORDER — LIDOCAINE HCL (CARDIAC) 20 MG/ML IV SOLN
INTRAVENOUS | Status: DC | PRN
  Administered 2016-12-30: 100 mg via INTRAVENOUS

## 2016-12-30 MED ORDER — ONDANSETRON HCL 4 MG/2ML IJ SOLN: 4.0000 mg | Freq: Once | INTRAMUSCULAR | Status: DC | PRN

## 2016-12-30 MED ORDER — ONDANSETRON HCL 4 MG/2ML IJ SOLN
INTRAMUSCULAR | Status: AC
  Filled 2016-12-30: qty 2

## 2016-12-30 MED ORDER — LIDOCAINE HCL (PF) 2 % IJ SOLN
INTRAMUSCULAR | Status: AC
  Filled 2016-12-30: qty 6

## 2016-12-30 MED ORDER — OXYCODONE-ACETAMINOPHEN 5-325 MG PO TABS: 1.0000 | ORAL_TABLET | ORAL | 0 refills | Status: DC | PRN

## 2016-12-30 MED ORDER — DEXAMETHASONE SODIUM PHOSPHATE 10 MG/ML IJ SOLN
INTRAMUSCULAR | Status: AC
  Filled 2016-12-30: qty 1

## 2016-12-30 MED ORDER — SUGAMMADEX SODIUM 200 MG/2ML IV SOLN
INTRAVENOUS | Status: DC | PRN
  Administered 2016-12-30: 500 mg via INTRAVENOUS

## 2016-12-30 MED ORDER — LIDOCAINE HCL 2 % EX GEL
CUTANEOUS | Status: DC | PRN
  Administered 2016-12-30: 1 via TOPICAL

## 2016-12-30 MED ORDER — LACTATED RINGERS IV SOLN
INTRAVENOUS | Status: DC
  Administered 2016-12-30: 09:00:00 via INTRAVENOUS

## 2016-12-30 MED ORDER — ACETAMINOPHEN NICU IV SYRINGE 10 MG/ML
INTRAVENOUS | Status: AC
  Filled 2016-12-30: qty 1

## 2016-12-30 SURGICAL SUPPLY — 43 items
BAG INFUSER PRESSURE 100CC (MISCELLANEOUS) ×4 IMPLANT
BLADE SURG SZ11 CARB STEEL (BLADE) ×4 IMPLANT
CANISTER SUCT 1200ML W/VALVE (MISCELLANEOUS) ×4 IMPLANT
CANISTER SUCT 3000ML PPV (MISCELLANEOUS) ×4 IMPLANT
CATH ROBINSON RED A/P 10FR (CATHETERS) ×4 IMPLANT
CATH ROBINSON RED A/P 16FR (CATHETERS) ×4 IMPLANT
CHLORAPREP W/TINT 26ML (MISCELLANEOUS) ×4 IMPLANT
DERMABOND ADVANCED (GAUZE/BANDAGES/DRESSINGS) ×1
DERMABOND ADVANCED .7 DNX12 (GAUZE/BANDAGES/DRESSINGS) ×3 IMPLANT
ELECT REM PT RETURN 9FT ADLT (ELECTROSURGICAL) ×4
ELECTRODE REM PT RTRN 9FT ADLT (ELECTROSURGICAL) ×3 IMPLANT
GLOVE BIO SURGEON STRL SZ8 (GLOVE) ×4 IMPLANT
GLOVE INDICATOR 8.0 STRL GRN (GLOVE) ×4 IMPLANT
GOWN STRL REUS W/ TWL LRG LVL3 (GOWN DISPOSABLE) ×3 IMPLANT
GOWN STRL REUS W/ TWL XL LVL3 (GOWN DISPOSABLE) ×3 IMPLANT
GOWN STRL REUS W/TWL LRG LVL3 (GOWN DISPOSABLE) ×1
GOWN STRL REUS W/TWL XL LVL3 (GOWN DISPOSABLE) ×1
IRRIGATION STRYKERFLOW (MISCELLANEOUS) ×3 IMPLANT
IRRIGATOR STRYKERFLOW (MISCELLANEOUS) ×4
IV LACTATED RINGERS 1000ML (IV SOLUTION) ×4 IMPLANT
KIT PINK PAD W/HEAD ARE REST (MISCELLANEOUS) ×4
KIT PINK PAD W/HEAD ARM REST (MISCELLANEOUS) ×3 IMPLANT
KIT RM TURNOVER CYSTO AR (KITS) ×4 IMPLANT
LABEL OR SOLS (LABEL) IMPLANT
NS IRRIG 500ML POUR BTL (IV SOLUTION) ×4 IMPLANT
PACK DNC HYST (MISCELLANEOUS) ×4 IMPLANT
PACK GYN LAPAROSCOPIC (MISCELLANEOUS) ×4 IMPLANT
PAD OB MATERNITY 4.3X12.25 (PERSONAL CARE ITEMS) ×4 IMPLANT
PAD PREP 24X41 OB/GYN DISP (PERSONAL CARE ITEMS) ×4 IMPLANT
POUCH ENDO CATCH 10MM SPEC (MISCELLANEOUS) IMPLANT
SCISSORS METZENBAUM CVD 33 (INSTRUMENTS) IMPLANT
SHEARS HARMONIC ACE PLUS 36CM (ENDOMECHANICALS) ×4 IMPLANT
SLEEVE ENDOPATH XCEL 5M (ENDOMECHANICALS) ×12 IMPLANT
SUT MNCRL 4-0 (SUTURE) ×1
SUT MNCRL 4-0 27XMFL (SUTURE) ×3
SUT VIC AB 0 CT2 27 (SUTURE) ×4 IMPLANT
SUT VIC AB 0 UR5 27 (SUTURE) ×4 IMPLANT
SUTURE MNCRL 4-0 27XMF (SUTURE) ×3 IMPLANT
SYR 30ML LL (SYRINGE) ×4 IMPLANT
TOWEL OR 17X26 4PK STRL BLUE (TOWEL DISPOSABLE) ×4 IMPLANT
TROCAR XCEL NON-BLD 5MMX100MML (ENDOMECHANICALS) ×4 IMPLANT
TUBING CONNECTING 10 (TUBING) ×4 IMPLANT
TUBING INSUFFLATOR HI FLOW (MISCELLANEOUS) ×4 IMPLANT

## 2016-12-30 NOTE — ED Notes (Signed)
Pt bladder scanned, approx 440 cc's urine scanned.
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
## 2016-12-30 NOTE — ED Notes (Signed)
Pt states she was able to urinate in lobby and is leaving

## 2016-12-30 NOTE — Telephone Encounter (Signed)
Pt called office to report she had same day surgery today had not voided but  was discharged from hospital. States she has a history of "bladder not waking up after surgery". States she has the urge to void but is unable. States she has had to be catheterized in the past. Encouraged pt to call same day surgery unit or go back to ED. Also encouraged her to call us back if needed.
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
## 2016-12-30 NOTE — Anesthesia Post-op Follow-up Note (Signed)
Anesthesia QCDR form completed.        

## 2016-12-30 NOTE — Transfer of Care (Signed)
Immediate Anesthesia Transfer of Care Note  Patient: Sarah Lawrence  Procedure(s) Performed: Procedure(s): INTRAUTERINE DEVICE (IUD) REMOVAL (N/A) HYSTEROSCOPY (N/A) LAPAROSCOPIC BILATERAL SALPINGECTOMY (Bilateral)  Patient Location: PACU  Anesthesia Type:General  Level of Consciousness: sedated  Airway & Oxygen Therapy: Patient Spontanous Breathing and Patient connected to face mask oxygen  Post-op Assessment: Report given to RN and Post -op Vital signs reviewed and stable  Post vital signs: Reviewed and stable  Last Vitals:  Vitals:   12/30/16 0812 12/30/16 1121  BP: 117/62 (!) 116/45  Pulse: 72 92  Resp: 17 12  Temp: 36.8 C (!) 36.1 C  SpO2: 876% 811%    Complications: No apparent anesthesia complications

## 2016-12-30 NOTE — ED Triage Notes (Signed)
Pt states had a D&C, hysteroscopy, tubal ligation this morning. Pt states has not urinated since 0730 this morning. Pt states hx of difficulty urinating after anesthesia, states in the past has had to have in and out cath to be able to urinate. Pt states she feels like she needs to urinate but has been unable to urinate at this time.

## 2016-12-30 NOTE — Interval H&P Note (Signed)
History and Physical Interval Note:  12/30/2016 9:01 AM  Sarah Lawrence  has presented today for surgery, with the diagnosis of misplaced iud, desires sterilization  The various methods of treatment have been discussed with the patient and family. After consideration of risks, benefits and other options for treatment, the patient has consented to  Procedure(s): INTRAUTERINE DEVICE (IUD) REMOVAL (N/A) HYSTEROSCOPY (N/A) LAPAROSCOPIC TUBAL LIGATION (Bilateral) as a surgical intervention .  The patient's history has been reviewed, patient examined, no change in status, stable for surgery.  I have reviewed the patient's chart and labs.  Questions were answered to the patient's satisfaction.     Hassell Done A [name]

## 2016-12-30 NOTE — Anesthesia Procedure Notes (Signed)
Procedure Name: Intubation Date/Time: 12/30/2016 9:33 AM Performed by: Doreen Salvage Pre-anesthesia Checklist: Patient identified, Patient being monitored, Timeout performed, Emergency Drugs available and Suction available Patient Re-evaluated:Patient Re-evaluated prior to induction Oxygen Delivery Method: Circle system utilized Preoxygenation: Pre-oxygenation with 100% oxygen Induction Type: IV induction and Cricoid Pressure applied Ventilation: Mask ventilation without difficulty Laryngoscope Size: Mac and 3 Grade View: Grade I Tube type: Oral Tube size: 7.0 mm Number of attempts: 1 Airway Equipment and Method: Stylet Placement Confirmation: ETT inserted through vocal cords under direct vision,  positive ETCO2 and breath sounds checked- equal and bilateral Secured at: 21 cm Tube secured with: Tape Dental Injury: Teeth and Oropharynx as per pre-operative assessment

## 2016-12-30 NOTE — Op Note (Signed)
Sarah Lawrence PROCEDURE DATE: 12/30/2016 11:06 AM  PREOPERATIVE DIAGNOSIS: misplaced iud, desires sterilization POSTOPERATIVE DIAGNOSIS: misplaced iud, desires sterilization, pelvic pain  PROCEDURE: Procedure(s): INTRAUTERINE DEVICE (IUD) REMOVAL (N/A) HYSTEROSCOPY/D&C LAPAROSCOPIC BILATERAL SALPINGECTOMY (Bilateral) and PERITONEAL BIOPSIES SURGEON:  Dr. Hassell Done A Shaleah Nissley ASSISTANT: Jeannie Fend, M.D. ANESTHESIA: General Endotracheal  INDICATIONS: 31 y.o. G2P0 with history of chronic pelvic pain concerning for endometriosis desiring surgical evaluation.  Patient has misplaced intrauterine IUD/removal could not be accomplished in the office. Patient is desiring permanent sterilization and is wishing to proceed with bilateral salpingectomy Please see preoperative notes for further details.   FINDINGS:  Small uterus, normal ovaries and fallopian tubes bilaterally. Anterior abdominal wall/omental adhesions were taken down with the Ace Harmonic scalpel. White lesions suspicious for endometriosis in the left ovarian fossa and cul-de-sac were biopsied. Normal upper abdomen.   I/O's: Total I/O In: 1000 [I.V.:1000] Out: 150 [Urine:150] SPECIMENS: Peritoneal biopsies (left ovarian fossa, cul-de-sac) COMPLICATIONS: None immediate COUNTS:  YES  PROCEDURE IN DETAIL: The patient was brought to the operating room where she was placed in the supine position.  General endotracheal anesthesia was induced without difficulty.  She was placed in the dorsal lithotomy position using bumblebee stirrups.  A ChloraPrep and Betadine, abdominal, perineal, intravaginal prep and drape was performed in standard fashion.  The timeout was completed.  The red Robison catheter 36 [place] was used to drain the bladder of clear urine.  A weighted speculum was placed into the vagina and a Hulka tenaculum was placed onto the cervix to facilitate uterine manipulation. Laparoscopy was performed in standard fashion.  The Optiview  5 mm trocar and sleeve were placed through a 5 mm subumbilical incision directly into the abdominal pelvic cavity, without evidence of bowel or vascular injury. Two Additional 5 mm ports were placed in the lower quadrants, respectively, under direct visualization.  The above noted findings were photo documented. The Ace Harmonic scalpel along with graspers were used to perform the adhesiolysis. Slight oozing from the omental adhesiolysis margin was cauterized with Kleppinger bipolar forceps. The bilateral salpingectomy was performed also with the graspers and Ace Harmonic scalpel. The mesosalpinx was sequentially clamped coagulated and cut down to the level of the uterine cornea at which point the fallopian tube was crossclamped and resected. Good hemostasis was noted. Similar procedure was carried out on the contralateral tube. The tubes were removed through the 5 mm ports without difficulty and sent to pathology. The pelvis was irrigated with normal saline solution and the irrigant fluid was aspirated. After verifying for optimal hemostasis, the abdomen was closed following removal of the instruments and release of the pneumoperitoneum. The skin incisions were closed with 4-0 Monocryl suture in a simple interrupted manner. Dermabond glue was placed over the incisions. Hysteroscopy was then performed. The Hulka tenaculum was removed from the cervix. A single-tooth tenaculum was placed on the anterior lip of the cervix. The endocervical canal was dilated using Hanks dilators up to a #20 [place] caliber. The myoma sure the hysteroscope was then used to assess the intrauterine cavity. The IUD was noted to be within the uterine fundus with the strings were curled around the device. Stone POLYP forceps were then used to extract the IUD. Curettage was performed with smooth and serrated curettes. Tissue was sent to pathology for evaluation. Following completion of hysteroscopy/D&C all instrumentation was removed from  the vagina. Patient was then awakened mobilized and taken to her room in satisfactory condition. Brayton Mars, MD [facility]

## 2016-12-30 NOTE — Discharge Instructions (Addendum)
AMBULATORY SURGERY  °DISCHARGE INSTRUCTIONS ° ° °1) The drugs that you were given will stay in your system until tomorrow so for the next 24 hours you should not: ° °A) Drive an automobile °B) Make any legal decisions °C) Drink any alcoholic beverage ° ° °2) You may resume regular meals tomorrow.  Today it is better to start with liquids and gradually work up to solid foods. ° °You may eat anything you prefer, but it is better to start with liquids, then soup and crackers, and gradually work up to solid foods. ° ° °3) Please notify your doctor immediately if you have any unusual bleeding, trouble breathing, redness and pain at the surgery site, drainage, fever, or pain not relieved by medication. ° °Please contact your physician with any problems or Same Day Surgery at [phone], Monday through Friday 6 am to 4 pm, or [facility] [place] Main number at [phone]. °

## 2016-12-31 LAB — SURGICAL PATHOLOGY

## 2016-12-31 LAB — RPR: RPR: NONREACTIVE

## 2016-12-31 NOTE — Anesthesia Postprocedure Evaluation (Signed)
Anesthesia Post Note  Patient: Sarah Lawrence  Procedure(s) Performed: Procedure(s) (LRB): INTRAUTERINE DEVICE (IUD) REMOVAL (N/A) HYSTEROSCOPY (N/A) LAPAROSCOPIC BILATERAL SALPINGECTOMY (Bilateral)  Patient location during evaluation: PACU Anesthesia Type: General Level of consciousness: awake and alert Pain management: pain level controlled Vital Signs Assessment: post-procedure vital signs reviewed and stable Respiratory status: spontaneous breathing, nonlabored ventilation, respiratory function stable and patient connected to nasal cannula oxygen Cardiovascular status: blood pressure returned to baseline and stable Postop Assessment: no apparent nausea or vomiting Anesthetic complications: no     Last Vitals:  Vitals:   12/30/16 1307 12/30/16 1333  BP: 112/67 (!) 102/51  Pulse: 86 88  Resp: 14 16  Temp:  (!) 36.2 C  SpO2: 97% 97%    Last Pain:  Vitals:   12/30/16 1333  TempSrc: Temporal  PainSc: 2                  Molli Barrows

## 2017-01-01 ENCOUNTER — Encounter: Payer: Self-pay | Admitting: Obstetrics and Gynecology

## 2017-01-02 ENCOUNTER — Encounter: Payer: Self-pay | Admitting: Obstetrics and Gynecology

## 2017-01-06 ENCOUNTER — Telehealth: Payer: Self-pay | Admitting: Obstetrics and Gynecology

## 2017-01-06 ENCOUNTER — Encounter: Payer: Self-pay | Admitting: Obstetrics and Gynecology

## 2017-01-06 NOTE — Telephone Encounter (Signed)
Pt stated that she had a procedure done on 12/30/16 and she would like a note faxed to her work stating that she can return to work today 01/06/17.  Fax#[phone] Please advise. Thanks TNP

## 2017-01-06 NOTE — Telephone Encounter (Signed)
Pt aware letter faxed with confirmation.

## 2017-01-07 ENCOUNTER — Telehealth: Payer: Self-pay | Admitting: Obstetrics and Gynecology

## 2017-01-07 NOTE — Telephone Encounter (Signed)
Pt is s/p lap with bx,BS, D&ciud removal on 12/30/2016. She has had on/off lite bleeding since surgery. Today she started bleeding heavier. Had changed her pad x2 today. Lmp- 12/10/2016. Advised this may be her cycle. Monitor for now. F/u at post op tomorrow.

## 2017-01-07 NOTE — Telephone Encounter (Signed)
Patient called and stated that she had a DNC/ tubal ligation, and the the patient was informed that she should only bleed for 48 hrs. The patient is concerned because she has been bleeding for 8 days and it has been heavy. Patient would like to speak with Joyice Faster a soon as possible to see if she needs to come in today or tomorrow to see Dr. Tennis Must. No other information was disclose other than wanting to speak with someone in regards to her questions and concerns.

## 2017-01-08 ENCOUNTER — Encounter: Payer: Self-pay | Admitting: Obstetrics and Gynecology

## 2017-01-08 ENCOUNTER — Ambulatory Visit (INDEPENDENT_AMBULATORY_CARE_PROVIDER_SITE_OTHER): Payer: 59 | Admitting: Obstetrics and Gynecology

## 2017-01-08 VITALS — BP 103/65 | HR 96 | Ht 65.0 in | Wt 250.4 lb

## 2017-01-08 DIAGNOSIS — Z98891 History of uterine scar from previous surgery: Secondary | ICD-10-CM

## 2017-01-08 DIAGNOSIS — N809 Endometriosis, unspecified: Secondary | ICD-10-CM

## 2017-01-08 DIAGNOSIS — N946 Dysmenorrhea, unspecified: Secondary | ICD-10-CM

## 2017-01-08 DIAGNOSIS — Z8759 Personal history of other complications of pregnancy, childbirth and the puerperium: Secondary | ICD-10-CM

## 2017-01-08 DIAGNOSIS — R102 Pelvic and perineal pain: Secondary | ICD-10-CM

## 2017-01-08 DIAGNOSIS — Z09 Encounter for follow-up examination after completed treatment for conditions other than malignant neoplasm: Secondary | ICD-10-CM

## 2017-01-08 NOTE — Progress Notes (Signed)
Chief complaint: 1. One week postop check 2. Status post hysteroscopy/D&C with IUD removal 3. Status post laparoscopic bilateral salpingectomy and peritoneal biopsies  Patient presents for 1 week postop check. She is doing well with normal bowel and bladder function. She initially had some mild constipation. Incisions are not causing any significant discomfort at this time.  Pathology from surgery: DIAGNOSIS:  A. FALLOPIAN TUBE, RIGHT; STERILIZATION:  - FALLOPIAN TUBE WITH FIMBRIATED END.   B. FALLOPIAN TUBE, LEFT; STERILIZATION:  - FALLOPIAN TUBE WITH FIMBRIATED END.   C. OVARIAN FOSSA, LEFT; BIOPSY:  - ENDOMETRIOSIS.   D. CUL-DE-SAC; BIOPSY:  - ENDOMETRIOSIS.   E. ENDOMETRIAL CURETTINGS:  - DECIDUALIZED STROMA CONSISTENT WITH PROGESTIN EFFECT.  - DEGENERATING FRAGMENTS OF TISSUE, CANNOT EXCLUDE RETAINED PRODUCTS OF  CONCEPTION.  - NEGATIVE FOR ATYPIA AND MALIGNANCY.   OBJECTIVE: BP 103/65   Pulse 96   Ht 5\' 5"  (1.651 m)   Wt 250 lb 6.4 oz (113.6 kg)   LMP 12/10/2016   BMI 41.67 kg/m  Pleasant well-appearing female in no acute distress Abdomen: Soft, nontender; laparoscopy incision sites are well approximated and still covered with Dermabond glue  ASSESSMENT: 1. Normal 1 week postop check 2. Status post hysteroscopy/D&C with IUD removal 3. Status post laparoscopic bilateral salpingectomy with peritoneal biopsies 4. Endometriosis  PLAN: 1. Resume activities as tolerated 2. Return in 4 months for assessment of endometriosis symptoms   Brayton Mars, MD  Note: This dictation was prepared with Dragon dictation along with smaller phrase technology. Any transcriptional errors that result from this process are unintentional.

## 2017-01-08 NOTE — Patient Instructions (Signed)
1. Resume activities as tolerated 2. Return in 4 months for follow-up on endometriosis symptoms

## 2017-01-10 ENCOUNTER — Encounter: Payer: Self-pay | Admitting: Obstetrics and Gynecology

## 2017-01-10 NOTE — Telephone Encounter (Signed)
Please do authorization for ADD med she has new insurance - she needs to get it asap   Thank you

## 2017-01-15 ENCOUNTER — Encounter: Payer: Self-pay | Admitting: Obstetrics and Gynecology

## 2017-01-16 ENCOUNTER — Other Ambulatory Visit: Payer: Self-pay | Admitting: Obstetrics and Gynecology

## 2017-01-16 MED ORDER — FLUOXETINE HCL 10 MG PO CAPS: 30.0000 mg | ORAL_CAPSULE | Freq: Every day | ORAL | 3 refills | Status: DC

## 2017-01-21 ENCOUNTER — Other Ambulatory Visit: Payer: Self-pay | Admitting: Obstetrics and Gynecology

## 2017-01-21 ENCOUNTER — Encounter: Payer: Self-pay | Admitting: Obstetrics and Gynecology

## 2017-01-21 DIAGNOSIS — Z79899 Other long term (current) drug therapy: Secondary | ICD-10-CM

## 2017-01-21 MED ORDER — LISDEXAMFETAMINE DIMESYLATE 70 MG PO CAPS: 70.0000 mg | ORAL_CAPSULE | ORAL | 0 refills | Status: DC

## 2017-02-17 ENCOUNTER — Encounter: Payer: Self-pay | Admitting: Obstetrics and Gynecology

## 2017-02-17 ENCOUNTER — Other Ambulatory Visit: Payer: Self-pay | Admitting: *Deleted

## 2017-02-17 MED ORDER — LISDEXAMFETAMINE DIMESYLATE 70 MG PO CAPS: 70.0000 mg | ORAL_CAPSULE | ORAL | 0 refills | Status: DC

## 2017-02-18 ENCOUNTER — Encounter: Payer: Self-pay | Admitting: Obstetrics and Gynecology

## 2017-02-21 ENCOUNTER — Other Ambulatory Visit: Payer: Self-pay | Admitting: Obstetrics and Gynecology

## 2017-02-21 ENCOUNTER — Encounter: Payer: Self-pay | Admitting: Obstetrics and Gynecology

## 2017-02-21 MED ORDER — FLUOXETINE HCL 10 MG PO CAPS: 10.0000 mg | ORAL_CAPSULE | Freq: Every day | ORAL | 3 refills | Status: DC

## 2017-02-27 ENCOUNTER — Encounter: Payer: Self-pay | Admitting: Obstetrics and Gynecology

## 2017-04-19 DIAGNOSIS — J018 Other acute sinusitis: Secondary | ICD-10-CM | POA: Diagnosis not present

## 2017-04-19 DIAGNOSIS — H66001 Acute suppurative otitis media without spontaneous rupture of ear drum, right ear: Secondary | ICD-10-CM | POA: Diagnosis not present

## 2017-05-06 ENCOUNTER — Encounter: Payer: Self-pay | Admitting: Obstetrics and Gynecology

## 2017-05-15 ENCOUNTER — Encounter: Payer: Self-pay | Admitting: Obstetrics and Gynecology

## 2017-05-28 DIAGNOSIS — M9901 Segmental and somatic dysfunction of cervical region: Secondary | ICD-10-CM | POA: Diagnosis not present

## 2017-05-28 DIAGNOSIS — G540 Brachial plexus disorders: Secondary | ICD-10-CM | POA: Diagnosis not present

## 2017-05-28 DIAGNOSIS — G44229 Chronic tension-type headache, not intractable: Secondary | ICD-10-CM | POA: Diagnosis not present

## 2017-06-05 ENCOUNTER — Encounter: Payer: Self-pay | Admitting: Obstetrics and Gynecology

## 2017-06-13 ENCOUNTER — Encounter: Payer: Self-pay | Admitting: Obstetrics and Gynecology

## 2017-06-27 DIAGNOSIS — Z719 Counseling, unspecified: Secondary | ICD-10-CM | POA: Diagnosis not present

## 2017-06-30 ENCOUNTER — Encounter: Payer: Self-pay | Admitting: Obstetrics and Gynecology

## 2017-06-30 ENCOUNTER — Other Ambulatory Visit: Payer: Self-pay | Admitting: *Deleted

## 2017-06-30 MED ORDER — LISDEXAMFETAMINE DIMESYLATE 70 MG PO CAPS: 70.0000 mg | ORAL_CAPSULE | ORAL | 0 refills | Status: DC

## 2017-07-01 ENCOUNTER — Encounter: Payer: Self-pay | Admitting: Obstetrics and Gynecology

## 2017-07-01 ENCOUNTER — Other Ambulatory Visit: Payer: Self-pay | Admitting: *Deleted

## 2017-07-01 ENCOUNTER — Encounter: Payer: Self-pay | Admitting: *Deleted

## 2017-07-01 MED ORDER — LISDEXAMFETAMINE DIMESYLATE 70 MG PO CAPS: 70.0000 mg | ORAL_CAPSULE | ORAL | 0 refills | Status: DC

## 2017-07-02 ENCOUNTER — Ambulatory Visit: Payer: 59 | Admitting: Obstetrics and Gynecology

## 2017-07-02 ENCOUNTER — Encounter: Payer: Self-pay | Admitting: Obstetrics and Gynecology

## 2017-07-02 VITALS — BP 100/70 | HR 82 | Ht 65.0 in | Wt 255.9 lb

## 2017-07-02 DIAGNOSIS — N946 Dysmenorrhea, unspecified: Secondary | ICD-10-CM | POA: Diagnosis not present

## 2017-07-02 DIAGNOSIS — Z98891 History of uterine scar from previous surgery: Secondary | ICD-10-CM | POA: Diagnosis not present

## 2017-07-02 DIAGNOSIS — N920 Excessive and frequent menstruation with regular cycle: Secondary | ICD-10-CM | POA: Diagnosis not present

## 2017-07-02 DIAGNOSIS — N809 Endometriosis, unspecified: Secondary | ICD-10-CM | POA: Diagnosis not present

## 2017-07-02 NOTE — Patient Instructions (Signed)
1.  Total abdominal hysterectomy with bilateral salpingectomy is to be scheduled for definitive treatment of symptomatic endometriosis 2.  Return 1 week prior to surgery for preop appointment   Abdominal Hysterectomy Abdominal hysterectomy is a surgical procedure to remove the womb (uterus). The uterus is the muscular organ that houses a developing baby. This surgery may be done if:  You have cancer.  You have growths (tumors or fibroids) in the uterus.  You have long-term (chronic) pain.  You are bleeding.  Your uterus has slipped down into your vagina (uterine prolapse).  You have a condition in which the tissue that lines the uterus grows outside of its normal location (endometriosis).  You have an infection in your uterus.  You are having problems with your menstrual cycle.  Depending on why you are having this procedure, you may also have other reproductive organs removed. These could include:  The part of your vagina that connects with your uterus (cervix).  The organs that make eggs (ovaries).  The tubes that connect the ovaries to the uterus (fallopian tubes).  Tell a health care provider about:  Any allergies you have.  All medicines you are taking, including vitamins, herbs, eye drops, creams, and over-the-counter medicines.  Any problems you or family members have had with anesthetic medicines.  Any blood disorders you have.  Any surgeries you have had.  Any medical conditions you have.  Whether you are pregnant or may be pregnant. What are the risks? Generally, this is a safe procedure. However, problems may occur, including:  Bleeding.  Infection.  Allergic reactions to medicines or dyes.  Damage to other structures or organs.  Nerve injury.  Decreased interest in sex or pain during sex.  Blood clots that can break free and travel to your lungs.  What happens before the procedure? Staying hydrated Follow instructions from your health care  provider about hydration, which may include:  Up to 2 hours before the procedure - you may continue to drink clear liquids, such as water, clear fruit juice, black coffee, and plain tea  Eating and drinking restrictions Follow instructions from your health care provider about eating and drinking, which may include:  8 hours before the procedure - stop eating heavy meals or foods such as meat, fried foods, or fatty foods.  6 hours before the procedure - stop eating light meals or foods, such as toast or cereal.  6 hours before the procedure - stop drinking milk or drinks that contain milk.  2 hours before the procedure - stop drinking clear liquids.  Medicines  Ask your health care provider about: ? Changing or stopping your regular medicines. This is especially important if you are taking diabetes medicines or blood thinners. ? Taking medicines such as aspirin and ibuprofen. These medicines can thin your blood. Do not take these medicines before your procedure if your health care provider instructs you not to.  You may be given antibiotic medicine to help prevent infection. Take it as told by your health care provider.  You may be asked to take laxatives to prevent constipation. General instructions  Ask your health care provider how your surgical site will be marked or identified.  You may be asked to shower with a germ-killing soap.  Plan to have someone take you home from the hospital.  Do not use any products that contain nicotine or tobacco, such as cigarettes and e-cigarettes. If you need help quitting, ask your health care provider.  You may have an  exam or testing.  You may have a blood or urine sample taken.  You may need to have an enema to clean out your rectum and lower colon.  This procedure can affect the way you feel about yourself. Talk to your health care provider about the physical and emotional changes this procedure may cause. What happens during the  procedure?  To lower your risk of infection: ? Your health care team will wash or sanitize their hands. ? Your skin will be washed with soap. ? Hair may be removed from the surgical area.  An IV tube will be inserted into one of your veins.  You will be given one or more of the following: ? A medicine to help you relax (sedative). ? A medicine to make you fall asleep (general anesthetic).  Tight-fitting (compression) stockings will be placed on your legs to promote circulation.  A thin, flexible tube (catheter) will be inserted to help drain your urine.  The surgeon will make a cut (incision) through the skin in your lower belly. The incision may go side-to-side or up-and-down.  The surgeon will move aside the body tissue that covers your uterus. The surgeon will then carefully take out your uterus along with any of the other organs that need to be removed.  Bleeding will be controlled with clamps or sutures.  The surgeon will close your incision with stitches (sutures), skin glue, or adhesive strips.  A bandage (dressing) will be placed over the incision. The procedure may vary among health care providers and hospitals. What happens after the procedure?  You will be given pain medicine as needed.  Your blood pressure, heart rate, breathing rate, and blood oxygen level will be monitored until the medicines you were given have worn off.  You will need to stay in the hospital to recover for one to two days. Ask your health care provider how long you will need to stay in the hospital after your procedure.  You may have a liquid diet at first. You will most likely return to your usual diet the day after surgery.  You will still have the urinary catheter in place. It will likely be removed the day after surgery.  You may have to wear compression stockings. These stockings help to prevent blood clots and reduce swelling in your legs.  You will be encouraged to walk as soon as  possible. You will also use a device or do breathing exercises to keep your lungs clear.  You may need to use a sanitary napkin for vaginal discharge. Summary  Abdominal hysterectomy is a surgical procedure to remove the womb (uterus). The uterus is the muscular organ that houses a developing baby.  This procedure can affect the way you feel about yourself. Talk to your health care provider about the physical and emotional changes this procedure may cause.  You will be given medicines for pain after the procedure.  You will need to stay in the hospital to recover. Ask your health care provider how long you will need to stay in the hospital after your procedure. This information is not intended to replace advice given to you by your health care provider. Make sure you discuss any questions you have with your health care provider. Document Released: 04/06/2013 Document Revised: 03/20/2016 Document Reviewed: 03/20/2016 Elsevier Interactive Patient Education  2017 Sarah Lawrence.  Hysterectomy Information A hysterectomy is a surgery to remove your uterus. After surgery, you will no longer have periods. Also, you will not be  able to get pregnant. Reasons for this surgery  You have bleeding that is not normal and keeps coming back.  You have lasting (chronic) lower belly (pelvic) pain.  You have a lasting infection.  The lining of your uterus grows outside your uterus.  The lining of your uterus grows in the muscle of your uterus.  Your uterus falls down into your vagina.  You have a growth in your uterus that causes problems.  You have cells that could turn into cancer (precancerous cells).  You have cancer of the uterus or cervix. Types There are 3 types of hysterectomies. Depending on the type, the surgery will:  Remove the top part of the uterus only.  Remove the uterus and the cervix.  Remove the uterus, cervix, and tissue that holds the uterus in place in the lower  belly.  Ways a hysterectomy can be performed There are 5 ways this surgery can be performed.  A cut (incision) is made in the belly (abdomen). The uterus is taken out through the cut.  A cut is made in the vagina. The uterus is taken out through the cut.  Three or four cuts are made in the belly. A surgical device with a camera is put through one of the cuts. The uterus is cut into small pieces. The uterus is taken out through the cuts or the vagina.  Three or four cuts are made in the belly. A surgical device with a camera is put through one of the cuts. The uterus is taken out through the vagina.  Three or four cuts are made in the belly. A surgical device that is controlled by a computer makes a visual image. The device helps the surgeon control the surgical tools. The uterus is cut into small pieces. The pieces are taken out through the cuts or through the vagina.  What can I expect after the surgery?  You will be given pain medicine.  You will need help at home for 3-5 days after surgery.  You will need to see your doctor in 2-4 weeks after surgery.  You may get hot flashes, have night sweats, and have trouble sleeping.  You may need to have Pap tests in the future if your surgery was related to cancer. Talk to your doctor. It is still good to have regular exams. This information is not intended to replace advice given to you by your health care provider. Make sure you discuss any questions you have with your health care provider. Document Released: 06/24/2011 Document Revised: 09/07/2015 Document Reviewed: 12/07/2012 Elsevier Interactive Patient Education  [employer].

## 2017-07-02 NOTE — Progress Notes (Signed)
Chief complaint: 1.  Endometriosis 2.  Status post laparoscopic excision and fulguration of endometriosis and bilateral salpingectomy, and hysteroscopy/D&C.  Patient presents for 79-month follow-up after surgery. Since surgery Sarah Lawrence has had severe dysmenorrhea and menorrhagia.  She reports significant pelvic cramping with a bilateral component which is not as severe as the central cramping.  She is not interested in hormonal contraception or IUD usage for management of endometriosis symptoms.  She declines other therapies including Depo-Lupron, Orlissa, and danazol.  Patient has 2 children, ages 8 and 58.  Both deliveries were by cesarean section delivery.  First C-section was due to failure to progress (patient never got beyond 2 cm).  Second C-section was a repeat.  Past medical history, past surgical history, problem list, medications, and allergies are reviewed  OBJECTIVE: BP 100/70   Pulse 82   Ht 5\' 5"  (1.651 m)   Wt 255 lb 14.4 oz (116.1 kg)   LMP 06/22/2017   BMI 42.58 kg/m  Pleasant well-appearing female in no acute distress.  Alert and oriented. Abdomen: Soft, nontender without organomegaly Pelvic exam: Anthropoid bony pelvis External genitalia-normal BUS-normal Vagina-normal estrogen effect; excellent vault support; cervix is suspended high within the vagina Cervix-1-2/4 cervical motion tenderness Uterus-2/4 tender; normal size and shape, mobile Adnexa-nonpalpable nontender Rectovaginal-normal external exam  ASSESSMENT: 1.  Symptomatic endometriosis-severe dysmenorrhea and menorrhagia 2.  Desires surgical intervention (ovarian sparing) 3.  Status post laparoscopic excision and fulguration of endometriosis and bilateral salpingectomy 4.  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  Options of management have been reviewed, including both medical and surgical treatments 2.  Patient desires definitive surgery, preferably  sparing the ovaries 3.  TAH bilateral salpingectomy is scheduled.  Pfannenstiel incision will likely be utilized. 4.  Patient is to return the week prior to surgery for preop appointment  A total of 15 minutes were spent face-to-face with the patient during this encounter and over half of that time dealt with counseling and coordination of care.  Brayton Mars, MD  Note: This dictation was prepared with Dragon dictation along with smaller phrase technology. Any transcriptional errors that result from this process are unintentional.

## 2017-07-09 ENCOUNTER — Ambulatory Visit
Admission: EM | Admit: 2017-07-09 | Discharge: 2017-07-09 | Disposition: A | Discharge status: Home / Self Care | Payer: 59 | Attending: Family Medicine | Admitting: Family Medicine

## 2017-07-09 ENCOUNTER — Other Ambulatory Visit: Payer: Self-pay | Admitting: Obstetrics and Gynecology

## 2017-07-09 ENCOUNTER — Other Ambulatory Visit: Payer: Self-pay

## 2017-07-09 ENCOUNTER — Encounter: Payer: Self-pay | Admitting: *Deleted

## 2017-07-09 ENCOUNTER — Encounter: Payer: Self-pay | Admitting: Obstetrics and Gynecology

## 2017-07-09 DIAGNOSIS — G43919 Migraine, unspecified, intractable, without status migrainosus: Secondary | ICD-10-CM

## 2017-07-09 MED ORDER — PROMETHAZINE HCL 25 MG/ML IJ SOLN
25.0000 mg | Freq: Once | INTRAMUSCULAR | Status: AC
  Administered 2017-07-09: 25 mg via INTRAMUSCULAR

## 2017-07-09 MED ORDER — SUMATRIPTAN SUCCINATE 50 MG PO TABS: 50.0000 mg | ORAL_TABLET | Freq: Once | ORAL | 0 refills | Status: DC

## 2017-07-09 MED ORDER — KETOROLAC TROMETHAMINE 60 MG/2ML IM SOLN
60.0000 mg | Freq: Once | INTRAMUSCULAR | Status: AC
  Administered 2017-07-09: 60 mg via INTRAMUSCULAR
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
## 2017-07-09 NOTE — ED Triage Notes (Signed)
PAtient started having symptoms of headache and nausea 3 days. Patient reports a history of tension headaches.

## 2017-07-17 ENCOUNTER — Encounter: Payer: Self-pay | Admitting: Obstetrics and Gynecology

## 2017-07-17 ENCOUNTER — Other Ambulatory Visit: Payer: Self-pay | Admitting: Obstetrics and Gynecology

## 2017-07-17 MED ORDER — ALPRAZOLAM 1 MG PO TABS: 1.0000 mg | ORAL_TABLET | Freq: Two times a day (BID) | ORAL | 1 refills | Status: DC | PRN

## 2017-07-22 ENCOUNTER — Encounter: Payer: Self-pay | Admitting: Obstetrics and Gynecology

## 2017-07-22 ENCOUNTER — Ambulatory Visit (INDEPENDENT_AMBULATORY_CARE_PROVIDER_SITE_OTHER): Payer: 59 | Admitting: Obstetrics and Gynecology

## 2017-07-22 VITALS — BP 94/64 | HR 81 | Ht 65.0 in | Wt 258.1 lb

## 2017-07-22 DIAGNOSIS — N809 Endometriosis, unspecified: Secondary | ICD-10-CM

## 2017-07-22 DIAGNOSIS — Z98891 History of uterine scar from previous surgery: Secondary | ICD-10-CM

## 2017-07-22 DIAGNOSIS — Z01818 Encounter for other preprocedural examination: Secondary | ICD-10-CM

## 2017-07-22 DIAGNOSIS — Z9889 Other specified postprocedural states: Secondary | ICD-10-CM

## 2017-07-22 DIAGNOSIS — R102 Pelvic and perineal pain: Secondary | ICD-10-CM
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
## 2017-07-22 NOTE — Patient Instructions (Signed)
Return for postop check the week after surgery as scheduled

## 2017-07-23 ENCOUNTER — Other Ambulatory Visit: Payer: Self-pay | Admitting: *Deleted

## 2017-07-23 ENCOUNTER — Other Ambulatory Visit: Payer: Self-pay

## 2017-07-23 ENCOUNTER — Encounter
Admission: RE | Admit: 2017-07-23 | Discharge: 2017-07-23 | Disposition: A | Discharge status: Home / Self Care | Payer: 59 | Source: Ambulatory Visit | Attending: Obstetrics and Gynecology | Admitting: Obstetrics and Gynecology

## 2017-07-23 DIAGNOSIS — Z0183 Encounter for blood typing: Secondary | ICD-10-CM | POA: Diagnosis not present

## 2017-07-23 DIAGNOSIS — Z01812 Encounter for preprocedural laboratory examination: Secondary | ICD-10-CM | POA: Diagnosis not present

## 2017-07-23 HISTORY — DX: Malignant (primary) neoplasm, unspecified: C80.1

## 2017-07-23 LAB — CBC WITH DIFFERENTIAL/PLATELET
BASOS ABS: 0.1 10*3/uL (ref 0–0.1)
BASOS PCT: 1 %
EOS ABS: 0.3 10*3/uL (ref 0–0.7)
Eosinophils Relative: 3 %
HEMATOCRIT: 39.9 % (ref 35.0–47.0)
Hemoglobin: 13.6 g/dL (ref 12.0–16.0)
Lymphocytes Relative: 29 %
Lymphs Abs: 2.3 10*3/uL (ref 1.0–3.6)
MCH: 29.9 pg (ref 26.0–34.0)
MCHC: 34.1 g/dL (ref 32.0–36.0)
MCV: 87.4 fL (ref 80.0–100.0)
MONO ABS: 0.4 10*3/uL (ref 0.2–0.9)
Monocytes Relative: 6 %
NEUTROS ABS: 4.8 10*3/uL (ref 1.4–6.5)
Neutrophils Relative %: 61 %
PLATELETS: 380 10*3/uL (ref 150–440)
RBC: 4.56 MIL/uL (ref 3.80–5.20)
RDW: 13.9 % (ref 11.5–14.5)
WBC: 7.8 10*3/uL (ref 3.6–11.0)

## 2017-07-23 LAB — TYPE AND SCREEN
ABO/RH(D): A POS
Antibody Screen: NEGATIVE

## 2017-07-23 LAB — RAPID HIV SCREEN (HIV 1/2 AB+AG)
HIV 1/2 ANTIBODIES: NONREACTIVE
HIV-1 P24 ANTIGEN - HIV24: NONREACTIVE

## 2017-07-23 NOTE — Telephone Encounter (Signed)
Patient called about her medication refill of her xanax. Please see previous message. Thank you

## 2017-07-23 NOTE — Patient Instructions (Addendum)
  Your procedure is scheduled on: Monday July 28, 2017 Report to Same Day Surgery 2nd floor medical mall ([place] Entrance-take elevator on left to 2nd floor.  Check in with surgery information desk.) To find out your arrival time please call [phone] between 1PM - 3PM on Friday July 25, 2017  Remember: Instructions that are not followed completely may result in serious medical risk, up to and including death, or upon the discretion of your surgeon and anesthesiologist your surgery may need to be rescheduled.    _x___ 1. Do not eat food after midnight the night before your procedure. You may drink clear liquids up to 2 hours before you are scheduled to arrive at the hospital for your procedure.  Do not drink clear liquids within 2 hours of your scheduled arrival to the hospital.  Clear liquids include  --Water or Apple juice without pulp  --Clear carbohydrate beverage such as Gatorade  --Black Coffee or Clear Tea (No milk, no creamers, do not add anything to the coffee or tea   No gum chewing or hard candies.     __x__ 2. No Alcohol for 24 hours before or after surgery.   __x__3. No Smoking or e-cigarettes for 24 prior to surgery.  Do not use any chewable tobacco products for at least 6 hour prior to surgery   ____  4. Bring all medications with you on the day of surgery if instructed.    __x__ 5. Notify your doctor if there is any change in your medical condition     (cold, fever, infections).   __x__6. On the morning of surgery brush your teeth with toothpaste and water.  You may rinse your mouth with mouth wash if you wish.  Do not swallow any toothpaste or mouthwash.   Do not wear jewelry, make-up, hairpins, clips or nail polish.  Do not wear lotions, powders, perfumes, or deodorant.  Do not shave 48 hours prior to surgery.   Do not bring valuables to the hospital.    [facility] is not responsible for any belongings or valuables.               Contacts, dentures or  bridgework may not be worn into surgery.  Leave your suitcase in the car. After surgery it may be brought to your room.  For patients admitted to the hospital, discharge time is determined by your treatment team.    Please read over the following fact sheets that you were given:   [facility] Preparing for Surgery and or MRSA Information   _x___ Take anti-hypertensive listed below, cardiac, seizure, asthma,     anti-reflux and psychiatric medicines. These include:  1. Fluoxetine/Prozac  2. Loratadine/Claritin  3. Sumatriptan/Imitrex if needed for migraines  4. Acyclovir/Zovirax if needed  5. Alprazoloam/Xanax if needed   _x___ Use CHG Soap or sage wipes as directed on instruction sheet   _x___ Follow recommendations from Cardiologist, Pulmonologist or PCP regarding stopping Aspirin, Coumadin, Plavix ,Eliquis, Effient, or Pradaxa, and Pletal.  _x___Stop Anti-inflammatories such as Advil, Aleve, Ibuprofen, Motrin, Naproxen, Naprosyn, Goodies powders or aspirin products. OK to take Tylenol and Celebrex.

## 2017-07-24 ENCOUNTER — Encounter: Payer: Self-pay | Admitting: Obstetrics and Gynecology

## 2017-07-24 ENCOUNTER — Telehealth: Payer: Self-pay | Admitting: Obstetrics and Gynecology

## 2017-07-24 LAB — RPR: RPR Ser Ql: NONREACTIVE

## 2017-07-24 NOTE — Telephone Encounter (Signed)
Patient called stating she has developed a rash on her hands. She is concerned because she is scheduled for surgery on Monday. She would like a call back. Thanks

## 2017-07-24 NOTE — Telephone Encounter (Signed)
Pt has developed a rash on her hands. Red and splotchy. NO itching. Slight swelling.  Claratin has helped some. No new bodywash, hand lotion, or hand sanitizer. Pt did her pre-op appt yesterday. She is concerned that it may be the tape they used. Advised pt to use benadryl 50 mg for the next couple of days. If sx gets worse to let us know. Inform hospital day of surgery.

## 2017-07-24 NOTE — Telephone Encounter (Signed)
LMTRC

## 2017-07-27 MED ORDER — CEFAZOLIN SODIUM-DEXTROSE 2-4 GM/100ML-% IV SOLN
2.0000 g | INTRAVENOUS | Status: AC
  Administered 2017-07-28: 2 g via INTRAVENOUS
  Filled 2017-07-27: qty 100

## 2017-07-28 ENCOUNTER — Inpatient Hospital Stay
Admission: RE | Admit: 2017-07-28 | Discharge: 2017-07-30 | DRG: 743 | Disposition: A | Discharge status: Home / Self Care | Payer: Commercial Managed Care - HMO | Source: Ambulatory Visit | Attending: Obstetrics and Gynecology | Admitting: Obstetrics and Gynecology

## 2017-07-28 ENCOUNTER — Inpatient Hospital Stay: Payer: Commercial Managed Care - HMO | Admitting: Certified Registered"

## 2017-07-28 ENCOUNTER — Encounter
Admission: RE | Disposition: A | Discharge status: Home / Self Care | Payer: Self-pay | Source: Ambulatory Visit | Attending: Obstetrics and Gynecology

## 2017-07-28 ENCOUNTER — Other Ambulatory Visit: Payer: Self-pay

## 2017-07-28 ENCOUNTER — Encounter: Payer: Self-pay | Admitting: *Deleted

## 2017-07-28 DIAGNOSIS — F909 Attention-deficit hyperactivity disorder, unspecified type: Secondary | ICD-10-CM | POA: Diagnosis present

## 2017-07-28 DIAGNOSIS — N809 Endometriosis, unspecified: Secondary | ICD-10-CM | POA: Diagnosis not present

## 2017-07-28 DIAGNOSIS — N946 Dysmenorrhea, unspecified: Secondary | ICD-10-CM | POA: Diagnosis not present

## 2017-07-28 DIAGNOSIS — N801 Endometriosis of ovary: Secondary | ICD-10-CM | POA: Diagnosis not present

## 2017-07-28 DIAGNOSIS — Z90710 Acquired absence of both cervix and uterus: Secondary | ICD-10-CM | POA: Diagnosis not present

## 2017-07-28 DIAGNOSIS — N920 Excessive and frequent menstruation with regular cycle: Secondary | ICD-10-CM | POA: Diagnosis present

## 2017-07-28 DIAGNOSIS — Z85828 Personal history of other malignant neoplasm of skin: Secondary | ICD-10-CM | POA: Diagnosis not present

## 2017-07-28 DIAGNOSIS — R339 Retention of urine, unspecified: Secondary | ICD-10-CM | POA: Diagnosis not present

## 2017-07-28 DIAGNOSIS — F419 Anxiety disorder, unspecified: Secondary | ICD-10-CM | POA: Diagnosis present

## 2017-07-28 DIAGNOSIS — Z90721 Acquired absence of ovaries, unilateral: Secondary | ICD-10-CM

## 2017-07-28 HISTORY — PX: ABDOMINAL HYSTERECTOMY: SHX81

## 2017-07-28 HISTORY — PX: OOPHORECTOMY: SHX6387

## 2017-07-28 LAB — POCT PREGNANCY, URINE: PREG TEST UR: NEGATIVE

## 2017-07-28 SURGERY — HYSTERECTOMY, ABDOMINAL
Anesthesia: General | Laterality: Right | Wound class: Clean Contaminated

## 2017-07-28 MED ORDER — GLYCOPYRROLATE 0.2 MG/ML IJ SOLN
INTRAMUSCULAR | Status: DC | PRN
  Administered 2017-07-28: 0.1 mg via INTRAVENOUS

## 2017-07-28 MED ORDER — KETOROLAC TROMETHAMINE 30 MG/ML IJ SOLN
INTRAMUSCULAR | Status: AC
  Filled 2017-07-28: qty 1

## 2017-07-28 MED ORDER — FENTANYL CITRATE (PF) 100 MCG/2ML IJ SOLN
INTRAMUSCULAR | Status: AC
  Administered 2017-07-28: 25 ug via INTRAVENOUS
  Filled 2017-07-28: qty 2

## 2017-07-28 MED ORDER — PHENYLEPHRINE HCL 10 MG/ML IJ SOLN
INTRAMUSCULAR | Status: AC
  Filled 2017-07-28: qty 1

## 2017-07-28 MED ORDER — SUGAMMADEX SODIUM 500 MG/5ML IV SOLN
INTRAVENOUS | Status: DC | PRN
  Administered 2017-07-28: 250 mg via INTRAVENOUS

## 2017-07-28 MED ORDER — OXYCODONE HCL 5 MG/5ML PO SOLN: 5.0000 mg | Freq: Once | ORAL | Status: DC | PRN

## 2017-07-28 MED ORDER — KETOROLAC TROMETHAMINE 30 MG/ML IJ SOLN: 30.0000 mg | Freq: Four times a day (QID) | INTRAMUSCULAR | Status: DC

## 2017-07-28 MED ORDER — CEFAZOLIN SODIUM-DEXTROSE 2-3 GM-%(50ML) IV SOLR
INTRAVENOUS | Status: AC
  Filled 2017-07-28: qty 50

## 2017-07-28 MED ORDER — ROCURONIUM BROMIDE 50 MG/5ML IV SOLN
INTRAVENOUS | Status: AC
  Filled 2017-07-28: qty 1

## 2017-07-28 MED ORDER — KETOROLAC TROMETHAMINE 30 MG/ML IJ SOLN
INTRAMUSCULAR | Status: DC | PRN
  Administered 2017-07-28: 30 mg via INTRAVENOUS

## 2017-07-28 MED ORDER — SIMETHICONE 80 MG PO CHEW
80.0000 mg | CHEWABLE_TABLET | Freq: Four times a day (QID) | ORAL | Status: DC | PRN
  Administered 2017-07-29 – 2017-07-30 (×3): 80 mg via ORAL
  Filled 2017-07-28 (×3): qty 1

## 2017-07-28 MED ORDER — MORPHINE SULFATE (PF) 2 MG/ML IV SOLN
1.0000 mg | INTRAVENOUS | Status: DC | PRN
  Administered 2017-07-28 – 2017-07-29 (×6): 2 mg via INTRAVENOUS
  Filled 2017-07-28 (×6): qty 1

## 2017-07-28 MED ORDER — ONDANSETRON HCL 4 MG/2ML IJ SOLN
INTRAMUSCULAR | Status: AC
Start: 2017-07-28 — End: 2017-07-28
  Filled 2017-07-28: qty 2

## 2017-07-28 MED ORDER — PROPOFOL 10 MG/ML IV BOLUS
INTRAVENOUS | Status: DC | PRN
  Administered 2017-07-28: 200 mg via INTRAVENOUS

## 2017-07-28 MED ORDER — SEVOFLURANE IN SOLN
RESPIRATORY_TRACT | Status: AC
  Filled 2017-07-28: qty 250

## 2017-07-28 MED ORDER — LIDOCAINE 5 % EX PTCH
MEDICATED_PATCH | CUTANEOUS | Status: DC | PRN
  Administered 2017-07-28: 1 via TRANSDERMAL

## 2017-07-28 MED ORDER — LACTATED RINGERS IV SOLN
INTRAVENOUS | Status: DC
  Administered 2017-07-28 (×2): via INTRAVENOUS

## 2017-07-28 MED ORDER — GLYCOPYRROLATE 0.2 MG/ML IJ SOLN
INTRAMUSCULAR | Status: AC
  Filled 2017-07-28: qty 1

## 2017-07-28 MED ORDER — FENTANYL CITRATE (PF) 100 MCG/2ML IJ SOLN
25.0000 ug | INTRAMUSCULAR | Status: DC | PRN
  Administered 2017-07-28 (×3): 25 ug via INTRAVENOUS

## 2017-07-28 MED ORDER — FAMOTIDINE 20 MG PO TABS
ORAL_TABLET | ORAL | Status: AC
  Administered 2017-07-28: 20 mg
  Filled 2017-07-28: qty 1

## 2017-07-28 MED ORDER — LIDOCAINE 5 % EX PTCH
MEDICATED_PATCH | CUTANEOUS | Status: AC
  Filled 2017-07-28: qty 1

## 2017-07-28 MED ORDER — ONDANSETRON HCL 4 MG/2ML IJ SOLN
4.0000 mg | Freq: Four times a day (QID) | INTRAMUSCULAR | Status: DC | PRN
  Administered 2017-07-28 – 2017-07-29 (×3): 4 mg via INTRAVENOUS
  Filled 2017-07-28 (×3): qty 2

## 2017-07-28 MED ORDER — SUGAMMADEX SODIUM 500 MG/5ML IV SOLN
INTRAVENOUS | Status: AC
  Filled 2017-07-28: qty 5

## 2017-07-28 MED ORDER — HYDROMORPHONE HCL 1 MG/ML IJ SOLN
INTRAMUSCULAR | Status: DC | PRN
  Administered 2017-07-28 (×2): 1 mg via INTRAVENOUS

## 2017-07-28 MED ORDER — FLUOXETINE HCL 10 MG PO CAPS
10.0000 mg | ORAL_CAPSULE | Freq: Every day | ORAL | Status: DC
  Administered 2017-07-28 – 2017-07-30 (×3): 10 mg via ORAL
  Filled 2017-07-28 (×3): qty 1

## 2017-07-28 MED ORDER — KETAMINE HCL 50 MG/ML IJ SOLN
INTRAMUSCULAR | Status: AC
  Filled 2017-07-28: qty 10

## 2017-07-28 MED ORDER — LACTATED RINGERS IV SOLN
INTRAVENOUS | Status: DC
  Administered 2017-07-28 – 2017-07-29 (×4): via INTRAVENOUS

## 2017-07-28 MED ORDER — PROPOFOL 10 MG/ML IV BOLUS
INTRAVENOUS | Status: AC
  Filled 2017-07-28: qty 20

## 2017-07-28 MED ORDER — MIDAZOLAM HCL 5 MG/5ML IJ SOLN
INTRAMUSCULAR | Status: AC
  Filled 2017-07-28: qty 5

## 2017-07-28 MED ORDER — KETOROLAC TROMETHAMINE 30 MG/ML IJ SOLN
30.0000 mg | Freq: Four times a day (QID) | INTRAMUSCULAR | Status: DC
  Administered 2017-07-28 – 2017-07-29 (×3): 30 mg via INTRAVENOUS
  Filled 2017-07-28 (×3): qty 1

## 2017-07-28 MED ORDER — ACETAMINOPHEN 10 MG/ML IV SOLN
INTRAVENOUS | Status: DC | PRN
  Administered 2017-07-28: 1000 mg via INTRAVENOUS

## 2017-07-28 MED ORDER — LISDEXAMFETAMINE DIMESYLATE 70 MG PO CAPS: 70.0000 mg | ORAL_CAPSULE | ORAL | Status: DC

## 2017-07-28 MED ORDER — DEXAMETHASONE SODIUM PHOSPHATE 10 MG/ML IJ SOLN
INTRAMUSCULAR | Status: AC
  Filled 2017-07-28: qty 1

## 2017-07-28 MED ORDER — FAMOTIDINE 20 MG PO TABS: 20.0000 mg | ORAL_TABLET | Freq: Once | ORAL | Status: DC

## 2017-07-28 MED ORDER — LIDOCAINE HCL (CARDIAC) 20 MG/ML IV SOLN
INTRAVENOUS | Status: DC | PRN
  Administered 2017-07-28: 50 mg via INTRAVENOUS

## 2017-07-28 MED ORDER — DEXAMETHASONE SODIUM PHOSPHATE 10 MG/ML IJ SOLN
INTRAMUSCULAR | Status: DC | PRN
  Administered 2017-07-28: 10 mg via INTRAVENOUS

## 2017-07-28 MED ORDER — ALPRAZOLAM 1 MG PO TABS
1.0000 mg | ORAL_TABLET | Freq: Two times a day (BID) | ORAL | Status: DC | PRN
  Administered 2017-07-28 – 2017-07-29 (×2): 1 mg via ORAL
  Filled 2017-07-28 (×2): qty 1

## 2017-07-28 MED ORDER — ACETAMINOPHEN NICU IV SYRINGE 10 MG/ML
INTRAVENOUS | Status: AC
  Filled 2017-07-28: qty 1

## 2017-07-28 MED ORDER — OXYCODONE HCL 5 MG PO TABS: 5.0000 mg | ORAL_TABLET | Freq: Once | ORAL | Status: DC | PRN

## 2017-07-28 MED ORDER — FENTANYL CITRATE (PF) 100 MCG/2ML IJ SOLN
INTRAMUSCULAR | Status: AC
  Filled 2017-07-28: qty 2

## 2017-07-28 MED ORDER — DOCUSATE SODIUM 100 MG PO CAPS
100.0000 mg | ORAL_CAPSULE | Freq: Two times a day (BID) | ORAL | Status: DC
  Administered 2017-07-28 – 2017-07-30 (×4): 100 mg via ORAL
  Filled 2017-07-28 (×4): qty 1

## 2017-07-28 MED ORDER — PHENYLEPHRINE HCL 10 MG/ML IJ SOLN
INTRAMUSCULAR | Status: DC | PRN
  Administered 2017-07-28 (×3): 100 ug via INTRAVENOUS

## 2017-07-28 MED ORDER — LIDOCAINE HCL (PF) 2 % IJ SOLN
INTRAMUSCULAR | Status: AC
  Filled 2017-07-28: qty 10

## 2017-07-28 MED ORDER — ROCURONIUM BROMIDE 100 MG/10ML IV SOLN
INTRAVENOUS | Status: DC | PRN
  Administered 2017-07-28: 50 mg via INTRAVENOUS
  Administered 2017-07-28: 10 mg via INTRAVENOUS

## 2017-07-28 MED ORDER — MIDAZOLAM HCL 2 MG/2ML IJ SOLN
INTRAMUSCULAR | Status: DC | PRN
  Administered 2017-07-28: 3 mg via INTRAVENOUS
  Administered 2017-07-28: 2 mg via INTRAVENOUS

## 2017-07-28 MED ORDER — KETAMINE HCL 10 MG/ML IJ SOLN
INTRAMUSCULAR | Status: DC | PRN
  Administered 2017-07-28 (×2): 25 mg via INTRAVENOUS

## 2017-07-28 MED ORDER — HYDROMORPHONE HCL 1 MG/ML IJ SOLN
INTRAMUSCULAR | Status: AC
  Filled 2017-07-28: qty 2

## 2017-07-28 MED ORDER — ONDANSETRON HCL 4 MG/2ML IJ SOLN
INTRAMUSCULAR | Status: DC | PRN
  Administered 2017-07-28: 4 mg via INTRAVENOUS

## 2017-07-28 MED ORDER — BISACODYL 10 MG RE SUPP: 10.0000 mg | Freq: Every day | RECTAL | Status: DC | PRN

## 2017-07-28 SURGICAL SUPPLY — 35 items
CANISTER SUCT 1200ML W/VALVE (MISCELLANEOUS) ×4 IMPLANT
CHLORAPREP W/TINT 26ML (MISCELLANEOUS) ×4 IMPLANT
DRAPE LAPAROTOMY 100X77 ABD (DRAPES) IMPLANT
DRAPE LAPAROTOMY TRNSV 106X77 (MISCELLANEOUS) ×4 IMPLANT
DRSG TELFA 3X8 NADH (GAUZE/BANDAGES/DRESSINGS) IMPLANT
ELECT BLADE 6 FLAT ULTRCLN (ELECTRODE) ×4 IMPLANT
ELECT BLADE 6.5 EXT (BLADE) IMPLANT
ELECT CAUTERY BLADE 6.4 (BLADE) ×4 IMPLANT
ELECT REM PT RETURN 9FT ADLT (ELECTROSURGICAL) ×4
ELECTRODE REM PT RTRN 9FT ADLT (ELECTROSURGICAL) ×3 IMPLANT
GAUZE SPONGE 4X4 12PLY STRL (GAUZE/BANDAGES/DRESSINGS) IMPLANT
GLOVE BIO SURGEON STRL SZ 6.5 (GLOVE) ×12 IMPLANT
GLOVE BIO SURGEON STRL SZ8 (GLOVE) IMPLANT
GLOVE INDICATOR 7.0 STRL GRN (GLOVE) IMPLANT
GLOVE INDICATOR 8.0 STRL GRN (GLOVE) ×8 IMPLANT
GLOVE ORTHO TXT STRL SZ7.5 (GLOVE) ×4 IMPLANT
GOWN STRL REUS W/ TWL LRG LVL3 (GOWN DISPOSABLE) ×6 IMPLANT
GOWN STRL REUS W/ TWL XL LVL3 (GOWN DISPOSABLE) ×3 IMPLANT
GOWN STRL REUS W/TWL LRG LVL3 (GOWN DISPOSABLE) ×2
GOWN STRL REUS W/TWL XL LVL3 (GOWN DISPOSABLE) ×1
KIT TURNOVER KIT A (KITS) ×4 IMPLANT
LABEL OR SOLS (LABEL) IMPLANT
LIGASURE IMPACT 36 18CM CVD LR (INSTRUMENTS) ×4 IMPLANT
PACK BASIN MAJOR ARMC (MISCELLANEOUS) ×4 IMPLANT
RETRACTOR WND ALEXIS-O 25 LRG (MISCELLANEOUS) ×3 IMPLANT
RTRCTR WOUND ALEXIS O 25CM LRG (MISCELLANEOUS) ×4
STAPLER SKIN PROX 35W (STAPLE) IMPLANT
SUT CHROMIC 0 CT 1 (SUTURE) IMPLANT
SUT MAXON ABS #0 GS21 30IN (SUTURE) ×8 IMPLANT
SUT SILK 0 (SUTURE) ×1
SUT SILK 0 30XBRD TIE 6 (SUTURE) ×3 IMPLANT
SUT VIC AB 0 CT1 27 (SUTURE) ×2
SUT VIC AB 0 CT1 27XCR 8 STRN (SUTURE) ×6 IMPLANT
TRAY FOLEY W/METER SILVER 16FR (SET/KITS/TRAYS/PACK) ×4 IMPLANT
WATER STERILE IRR 1000ML POUR (IV SOLUTION) IMPLANT

## 2017-07-28 NOTE — Anesthesia Post-op Follow-up Note (Signed)
Anesthesia QCDR form completed.        

## 2017-07-28 NOTE — Transfer of Care (Signed)
Immediate Anesthesia Transfer of Care Note  Patient: Sarah Lawrence  Procedure(s) Performed: HYSTERECTOMY ABDOMINAL (N/A ) OOPHORECTOMY (Right )  Patient Location: PACU  Anesthesia Type:General  Level of Consciousness: awake, alert , oriented and patient cooperative  Airway & Oxygen Therapy: Patient Spontanous Breathing and Patient connected to face mask oxygen  Post-op Assessment: Report given to RN, Post -op Vital signs reviewed and stable and Patient moving all extremities  Post vital signs: Reviewed and stable  Last Vitals:  Vitals Value Taken Time  BP 117/84 07/28/2017  9:52 AM  Temp    Pulse 91 07/28/2017  9:53 AM  Resp 16 07/28/2017  9:53 AM  SpO2 100 % 07/28/2017  9:53 AM  Vitals shown include unvalidated device data.  Last Pain:  Vitals:   07/28/17 0615  TempSrc: Tympanic         Complications: No apparent anesthesia complications
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
## 2017-07-28 NOTE — Anesthesia Postprocedure Evaluation (Signed)
Anesthesia Post Note  Patient: Sarah Lawrence  Procedure(s) Performed: HYSTERECTOMY ABDOMINAL (N/A ) OOPHORECTOMY (Right )  Patient location during evaluation: PACU Anesthesia Type: General Level of consciousness: awake and alert Pain management: pain level controlled Vital Signs Assessment: post-procedure vital signs reviewed and stable Respiratory status: spontaneous breathing, nonlabored ventilation, respiratory function stable and patient connected to nasal cannula oxygen Cardiovascular status: blood pressure returned to baseline and stable Postop Assessment: no apparent nausea or vomiting Anesthetic complications: no     Last Vitals:  Vitals:   07/28/17 1119 07/28/17 1210  BP: 127/69 111/70  Pulse: 88 88  Resp: 16 18  Temp: 36.5 C 36.6 C  SpO2: 96% 95%    Last Pain:  Vitals:   07/28/17 1210  TempSrc: Oral  PainSc:                  Precious Haws Piscitello

## 2017-07-28 NOTE — Op Note (Signed)
OPERATIVE NOTE:  Sarah Lawrence PROCEDURE DATE: 07/28/2017   PREOPERATIVE DIAGNOSIS: 1.  Symptomatic endometriosis  POSTOPERATIVE DIAGNOSIS: 1.  Symptomatic endometriosis  PROCEDURE: TAH right oophorectomy SURGEON:  Brayton Mars, MD ASSISTANTS: Jeannie Fend, MD ANESTHESIA: General INDICATIONS: 32 y.o. G2P 2002, status post cesarean section x2, with history of endometriosis, status post laparoscopic excision and fulguration endometriosis and bilateral salpingectomy, presents now for definitive surgery due to chronic persistent pain.  FINDINGS: Endometrioma within the preperitoneal fat just beneath the fascia on the right; this area was drained (chocolate cyst), and the scar tissue was cauterized.  The left ovary was normal.  Right ovary had endometriosis implants with scarring and attachment to the posterior aspect of the uterine fundus.  Because of endometriosis involving the right adnexa, decision was made to perform the right oophorectomy the uterus appeared grossly normal.  Fallopian tubes are surgically absent.   I/O's: Total I/O In: 1000 [I.V.:1000] Out: 150 [Blood:150] COUNTS:  YES SPECIMENS: Uterus with cervix and right ovary ANTIBIOTIC PROPHYLAXIS:Ancef 2 grams COMPLICATIONS: None immediate  PROCEDURE IN DETAIL: Patient was brought room placed in supine position.  General endotracheal anesthesia was induced without difficulty.  Foley catheter was placed and was draining clear yellow urine.  A ChloraPrep and Hibiclens abdominal/perineal/vaginal prep and drape was performed in standard fashion. Timeout was completed. Pfannenstiel incision was made in the abdomen 2 fingerbreadths above the symphysis pubis.  The fascia was incised transversely.  Moderate scar was encountered in the abdominal wall.  The rectus muscle was dissected off the fascia through sharp and blunt dissection.  Inferiorly, the endometriosis cyst was identified and drained in the subfascial region above the  bladder.  This area was cauterized with Bovie cautery.  Peritoneal cavity was entered.  The Alexis self-retaining retractor was placed.  Wet laps were used to help pack off the bowel.  The uterus was grasped with a uterine manipulator.  Lower uterine segment adhesions were taken down sharply with Metzenbaum scissors.  Decision was made to remove the right ovary with the uterus.  The LigaSure impact instrument was used to ligate cauterize and cut the right infundibulopelvic ligament.  The cardinal broad ligament complexes were sequentially ligated cauterized and cut, down to the level of the uterosacral ligament on the right.  The LigaSure impact instrument was used on the left side to ligate cauterize and cut the left utero-ovarian ligament (ovarian conservation desired).  The cardinal broad ligament complexes were also taken down using the LigaSure impact instrument.  Care was taken to avoid the course of the ureters.  At the level of the uterosacral ligaments the uterine arteries were dissected and clamped coagulated and cut using the LigaSure instrument.  The remainder of the cervix was taken down with straight Heaney clamps followed by cutting and stick tied using 0 Vicryl suture.  At the level of the cervicovaginal junction curved Heaney clamps were used to cross-clamp the structure and remove the specimen.  The angles of the vagina were closed using a Richardson stitch of 0 Vicryl.  The intervening vagina was ligated using a figure-of-eight suture of 0 Vicryl.  The pelvis was copiously irrigated.  Irrigant fluid was aspirated.  Small capillary bleeders were cauterized using Bovie cautery.  Once satisfied with hemostasis the laps were removed from the abdomen with the Alexis retractor.  The incision was then closed in layers with 0 Maxon being used on the fascia in a simple running manner.  Subcutaneous tissues were reapproximated using 2-0 Vicryl in a  simple.  Skin was closed with a simple running stitch of  4-0 Vicryl.  Dermabond glue was placed over the incision.  Steri-Strips and benzoin were also applied.  This was followed by a Telfa dressing and tape.  Lidoderm patches also applied for analgesia.  Upon completion of the procedure patient was awakened extubated, and taken to recovery in satisfactory condition  Anesha Hackert A. Zipporah Plants, MD, ACO[facility]

## 2017-07-28 NOTE — Interval H&P Note (Signed)
History and Physical Interval Note:  07/28/2017 7:26 AM  Sarah Lawrence  has presented today for surgery, with the diagnosis of ENDOMETRIOSIS, DYSMENORRHEA, MENORRHAGIA  The various methods of treatment have been discussed with the patient and family. After consideration of risks, benefits and other options for treatment, the patient has consented to  Procedure(s): HYSTERECTOMY ABDOMINAL WITH  BILATERAL SALPINGECTOMY (Bilateral) as a surgical intervention .  The patient's history has been reviewed, patient examined, no change in status, stable for surgery.  I have reviewed the patient's chart and labs.  Questions were answered to the patient's satisfaction.     Hassell Done A Jaziah Kwasnik

## 2017-07-28 NOTE — Anesthesia Procedure Notes (Signed)
Procedure Name: Intubation Date/Time: 07/28/2017 7:40 AM Performed by: Nile Riggs, CRNA Pre-anesthesia Checklist: Patient identified, Emergency Drugs available, Suction available, Patient being monitored and Timeout performed Patient Re-evaluated:Patient Re-evaluated prior to induction Oxygen Delivery Method: Circle system utilized Preoxygenation: Pre-oxygenation with 100% oxygen Induction Type: IV induction, Rapid sequence and Cricoid Pressure applied Laryngoscope Size: Miller and 2 Grade View: Grade I Tube type: Oral Tube size: 7.5 mm Number of attempts: 1 Airway Equipment and Method: Stylet Placement Confirmation: ETT inserted through vocal cords under direct vision,  positive ETCO2,  CO2 detector and breath sounds checked- equal and bilateral Secured at: 22 cm Tube secured with: Tape Dental Injury: Teeth and Oropharynx as per pre-operative assessment  Comments: No mask ventilation d/t RSI (large intake of water >2hrs prior to induction of anesthesia)

## 2017-07-29 ENCOUNTER — Encounter: Payer: Self-pay | Admitting: Obstetrics and Gynecology

## 2017-07-29 DIAGNOSIS — N946 Dysmenorrhea, unspecified: Secondary | ICD-10-CM

## 2017-07-29 DIAGNOSIS — N801 Endometriosis of ovary: Secondary | ICD-10-CM

## 2017-07-29 LAB — HEMOGLOBIN: HEMOGLOBIN: 12.2 g/dL (ref 12.0–16.0)

## 2017-07-29 LAB — SURGICAL PATHOLOGY

## 2017-07-29 MED ORDER — HYDROMORPHONE HCL 2 MG PO TABS
2.0000 mg | ORAL_TABLET | ORAL | Status: DC | PRN
  Administered 2017-07-29 – 2017-07-30 (×7): 2 mg via ORAL
  Filled 2017-07-29 (×7): qty 1

## 2017-07-29 MED ORDER — IBUPROFEN 600 MG PO TABS
600.0000 mg | ORAL_TABLET | Freq: Four times a day (QID) | ORAL | Status: DC
  Administered 2017-07-29 – 2017-07-30 (×5): 600 mg via ORAL
  Filled 2017-07-29 (×5): qty 1

## 2017-07-29 MED ORDER — ACETAMINOPHEN 500 MG PO TABS
1000.0000 mg | ORAL_TABLET | Freq: Four times a day (QID) | ORAL | Status: DC
Start: 2017-07-29 — End: 2017-07-30
  Administered 2017-07-29 – 2017-07-30 (×5): 1000 mg via ORAL
  Filled 2017-07-29 (×5): qty 2

## 2017-07-29 NOTE — Progress Notes (Signed)
1 Day Post-Op Procedure(s) (LRB): HYSTERECTOMY ABDOMINAL (N/A) OOPHORECTOMY (Right) Indications: Symptomatic endometriosis  Subjective: Patient reports good oral intake including liquids and solids.  Pain is under reasonable control.  The bandage had to be removed today because of possible allergic reaction to the tape.  Urine output was good overnight; however, she has been unable to urinate following removal, requiring straight cath x2.  This is not an unusual incident for her and she is asking to allow straight catheterization to be done as needed over the next 24 hours in order to allow return of normal bladder function.  Objective: I have reviewed patient's vital signs, intake and output, medications and labs.  General: alert, cooperative and no distress Abdomen: Incision clean and dry, Steri-Stripped without evidence of infection extremities warm and dry; no Homans sign/calf tenderness  Assessment: s/p Procedure(s): HYSTERECTOMY ABDOMINAL (N/A) OOPHORECTOMY (Right): stable and Urinary retention (history of same in past)  Plan: Advance diet Encourage ambulation Advance to PO medication Straight cath as needed for inability to void  LOS: 1 day    Alanda Slim Jayron Maqueda 07/29/2017, 3:37 PM

## 2017-07-29 NOTE — Progress Notes (Signed)
Dr. Enzo Bi notified patient has emptied her bladder independently this evening and requesting IV removed and shower. Verbal order to stop IVF, removed IV, shower. Patient's pain under control w/ PO medications and walking laps around the unit this evening.

## 2017-07-30 ENCOUNTER — Encounter: Payer: Self-pay | Admitting: Obstetrics and Gynecology

## 2017-07-30 MED ORDER — HYDROMORPHONE HCL 2 MG PO TABS: 2.0000 mg | ORAL_TABLET | ORAL | 0 refills | Status: DC | PRN

## 2017-07-30 MED ORDER — IBUPROFEN 600 MG PO TABS: 600.0000 mg | ORAL_TABLET | Freq: Four times a day (QID) | ORAL | 0 refills | Status: DC

## 2017-07-30 MED ORDER — ACETAMINOPHEN 500 MG PO TABS: 1000.0000 mg | ORAL_TABLET | Freq: Four times a day (QID) | ORAL | 0 refills | Status: AC

## 2017-07-30 NOTE — Discharge Summary (Signed)
Physician Discharge Summary  Patient ID: Sarah Lawrence MRN: [id number] DOB/AGE: [birth year] 32 y.o.  Admit date: 07/28/2017 Discharge date: 07/30/2017  Admission Diagnoses: Endometriosis  Discharge Diagnoses:  Endometriosis  Operative Procedures: Procedure(s): HYSTERECTOMY ABDOMINAL (N/A) OOPHORECTOMY (Right)  Hospital Course: Uncomplicated .  Patient had urinary retention following catheter removal which required intermittent catheterization x2.  Symptoms subsequently resolved on day of discharge.  Pathology:  Surgical Pathology  CASE: [id number]  PATIENT: Sarah Lawrence  Surgical Pathology Report      SPECIMEN SUBMITTED:  A. Uterus with cervix, right ovary   CLINICAL HISTORY:  None provided   PRE-OPERATIVE DIAGNOSIS:  Endometriosis, dysmenorrhea, menorrhagia   POST-OPERATIVE DIAGNOSIS:  Symptomatic endometriosis      DIAGNOSIS:  A. UTERUS WITH CERVIX AND RIGHT OVARY; HYSTERECTOMY WITH RIGHT  OOPHORECTOMY:  - CERVIX WITHIN NORMAL LIMITS.  - PROLIFERATIVE ENDOMETRIUM.  - OVARY WITH FOLLICULAR CYSTS AND FOCAL ENDOMETRIOSIS.        Significant Diagnostic Studies:  Lab Results  Component Value Date   HGB 12.2 07/29/2017   HGB 13.6 07/23/2017   HGB 14.6 12/30/2016   Lab Results  Component Value Date   HCT 39.9 07/23/2017   HCT 41.9 12/30/2016   HCT 42.9 02/12/2016   CBC Latest Ref Rng & Units 07/29/2017 07/23/2017 12/30/2016  WBC 3.6 - 11.0 K/uL - 7.8 8.8  Hemoglobin 12.0 - 16.0 g/dL 12.2 13.6 14.6  Hematocrit 35.0 - 47.0 % - 39.9 41.9  Platelets 150 - 440 K/uL - 380 363     Discharged Condition: good  Discharge Exam: Blood pressure 106/70, pulse 61, temperature 97.9 F (36.6 C), temperature source Oral, resp. rate 20, height 5\' 5"  (1.651 m), weight 258 lb (117 kg), last menstrual period 07/18/2017, SpO2 98 %. Incision/Wound: clean, dry and no drainage  Disposition: Discharge disposition: 01-Home or Self Care       Discharge Instructions     Discharge patient   Complete by:  As directed    Discharge disposition:  01-Home or Self Care   Discharge patient date:  07/30/2017     Allergies as of 07/30/2017   No Known Allergies     Medication List    TAKE these medications   acetaminophen 500 MG tablet Commonly known as:  TYLENOL Take 2 tablets (1,000 mg total) by mouth every 6 (six) hours.   acyclovir 400 MG tablet Commonly known as:  ZOVIRAX Take 400 mg by mouth 2 (two) times daily as needed (cold sores).   ALPRAZolam 1 MG tablet Commonly known as:  XANAX Take 1 tablet (1 mg total) by mouth 2 (two) times daily as needed for anxiety.   FLUoxetine 10 MG capsule Commonly known as:  PROZAC Take 1 capsule (10 mg total) daily by mouth.   HYDROmorphone 2 MG tablet Commonly known as:  DILAUDID Take 1 tablet (2 mg total) by mouth every 4 (four) hours as needed for severe pain (q 4-6 hrs prn severe pain).   ibuprofen 600 MG tablet Commonly known as:  ADVIL,MOTRIN Take 1 tablet (600 mg total) by mouth every 6 (six) hours. What changed:    medication strength  how much to take  when to take this   lisdexamfetamine 70 MG capsule Commonly known as:  VYVANSE Take 1 capsule (70 mg total) by mouth every morning.   loratadine 10 MG tablet Commonly known as:  CLARITIN Take 10 mg by mouth daily as needed for allergies.   SUMAtriptan 50 MG tablet Commonly known as:  IMITREX Take  1 tablet (50 mg total) by mouth once for 1 dose. May repeat in 2 hours if headache persists or recurs.      Follow-up Information    Raizy Auzenne, Alanda Slim, MD. Go in 1 week(s).   Specialties:  Obstetrics and Gynecology, Radiology Why:  Postop check Contact information: [address] [place] [address] [phone]           Signed: Alanda Slim Xander Jutras 07/30/2017, 4:00 PM

## 2017-07-30 NOTE — Progress Notes (Signed)
Discharge inst reviewed with pt.  Verb u/o

## 2017-07-30 NOTE — Progress Notes (Signed)
Discharge to home to car via auxillary in wheelchair

## 2017-07-31 ENCOUNTER — Encounter: Payer: Self-pay | Admitting: Obstetrics and Gynecology

## 2017-08-04 ENCOUNTER — Encounter: Payer: Self-pay | Admitting: Obstetrics and Gynecology

## 2017-08-05 ENCOUNTER — Encounter: Payer: Self-pay | Admitting: Obstetrics and Gynecology

## 2017-08-05 ENCOUNTER — Ambulatory Visit (INDEPENDENT_AMBULATORY_CARE_PROVIDER_SITE_OTHER): Payer: 59 | Admitting: Obstetrics and Gynecology

## 2017-08-05 VITALS — BP 104/70 | HR 90 | Ht 65.0 in | Wt 256.7 lb

## 2017-08-05 DIAGNOSIS — K5903 Drug induced constipation: Secondary | ICD-10-CM

## 2017-08-05 DIAGNOSIS — Z90721 Acquired absence of ovaries, unilateral: Secondary | ICD-10-CM

## 2017-08-05 DIAGNOSIS — R300 Dysuria: Secondary | ICD-10-CM | POA: Diagnosis not present

## 2017-08-05 DIAGNOSIS — N809 Endometriosis, unspecified: Secondary | ICD-10-CM

## 2017-08-05 DIAGNOSIS — Z90710 Acquired absence of both cervix and uterus: Secondary | ICD-10-CM

## 2017-08-05 DIAGNOSIS — Z09 Encounter for follow-up examination after completed treatment for conditions other than malignant neoplasm: Secondary | ICD-10-CM

## 2017-08-05 LAB — POCT URINALYSIS DIPSTICK
Bilirubin, UA: NEGATIVE
Blood, UA: NEGATIVE
Glucose, UA: NEGATIVE
KETONES UA: NEGATIVE
Leukocytes, UA: NEGATIVE
NITRITE UA: NEGATIVE
ODOR: NEGATIVE
PROTEIN UA: NEGATIVE
Spec Grav, UA: 1.01 (ref 1.010–1.025)
Urobilinogen, UA: 0.2 E.U./dL
pH, UA: 6.5 (ref 5.0–8.0)

## 2017-08-05 NOTE — Progress Notes (Signed)
Chief complaint: 1.  Postop check 2.  Status post TAH right oophorectomy  Status post TAH right oophorectomy on 07/28/2017.  Pathology: DIAGNOSIS:  A. UTERUS WITH CERVIX AND RIGHT OVARY; HYSTERECTOMY WITH RIGHT  OOPHORECTOMY:  - CERVIX WITHIN NORMAL LIMITS.  - PROLIFERATIVE ENDOMETRIUM.  - OVARY WITH FOLLICULAR CYSTS AND FOCAL ENDOMETRIOSIS  Patient reports moderate constipation, improving after discontinuation of Dilaudid.  She does report some urinary frequency and dysuria; patient had to be catheterized several times postoperatively because of urinary retention.  She denies fevers chills or sweats.  She denies flank pain.  Pain is fairly well controlled; currently taking ibuprofen and Tylenol as needed.  Some mild burning at the incision site is noted.  OBJECTIVE: Ht 5\' 5"  (1.651 m)   LMP 07/18/2017 (Exact Date)   BMI 42.93 kg/m  Pleasant well-appearing female no acute distress.  Alert and oriented. Abdomen: Soft, nontender; Pfannenstiel incision is well healed; Steri-Strips well applied.  No evidence of hernia. Extremities warm and dry  ASSESSMENT: 1.  Normal 1 week postop check status post TAH right oophorectomy for symptomatic endometriosis 2.  Dysuria 3.  Drug-induced constipation  PLAN: 1.  Gradually resume activities as tolerated 2.  No intercourse for another 4 weeks 3.  Return on 09/10/2017 for final postop check 4.  Increase water intake 5.  Colace 100 mg twice daily 6.  Urinalysis and urine culture is obtained today  Brayton Mars, MD  Note: This dictation was prepared with Dragon dictation along with smaller phrase technology. Any transcriptional errors that result from this process are unintentional.

## 2017-08-05 NOTE — Patient Instructions (Addendum)
1.  Return on 09/10/2017 for final postop check 2.  Gradually resume activities as tolerated; no intercourse until final postop check 3.  Urinalysis and urine cultures obtained today to rule out UTI 4.  Recommend Colace stool softener to help with constipation along with increase water intake and increase in activity level

## 2017-08-06 ENCOUNTER — Encounter: Payer: Self-pay | Admitting: Obstetrics and Gynecology

## 2017-08-06 ENCOUNTER — Telehealth: Payer: Self-pay | Admitting: Obstetrics and Gynecology

## 2017-08-06 NOTE — Telephone Encounter (Signed)
Pt came by office an picked up FMLA.

## 2017-08-06 NOTE — Telephone Encounter (Signed)
The patient called to check on the status of the most recent Short Term Disability paperwork. The patient would like a call back. Please advise.

## 2017-08-07 ENCOUNTER — Telehealth: Payer: Self-pay

## 2017-08-07 ENCOUNTER — Encounter: Payer: Self-pay | Admitting: Obstetrics and Gynecology

## 2017-08-07 LAB — URINE CULTURE

## 2017-08-07 MED ORDER — NITROFURANTOIN MONOHYD MACRO 100 MG PO CAPS: 100.0000 mg | ORAL_CAPSULE | Freq: Two times a day (BID) | ORAL | 0 refills | Status: DC

## 2017-08-07 NOTE — Telephone Encounter (Signed)
-----   Message from Brayton Mars, MD sent at 08/07/2017  2:41 PM EDT ----- Please notify - Abnormal Labs Please order Macrobid twice a day for 7 days thanks

## 2017-08-07 NOTE — Telephone Encounter (Signed)
Pt aware. Med erx. 

## 2017-08-08 ENCOUNTER — Encounter: Payer: Self-pay | Admitting: Obstetrics and Gynecology

## 2017-08-08 ENCOUNTER — Other Ambulatory Visit: Payer: Self-pay

## 2017-08-08 MED ORDER — FLUCONAZOLE 150 MG PO TABS: 150.0000 mg | ORAL_TABLET | Freq: Every day | ORAL | 1 refills | Status: DC

## 2017-08-12 ENCOUNTER — Encounter: Payer: Self-pay | Admitting: Obstetrics and Gynecology

## 2017-08-12 ENCOUNTER — Telehealth: Payer: Self-pay | Admitting: Obstetrics and Gynecology

## 2017-08-12 NOTE — Telephone Encounter (Signed)
See my chart message

## 2017-08-12 NOTE — Telephone Encounter (Signed)
The patient called and stated that she would like to speak with Dr. Tennis Must or Crystal in regards to her having a small opening on the left side of her incision. Please advise.

## 2017-08-13 ENCOUNTER — Encounter: Payer: Self-pay | Admitting: Obstetrics and Gynecology

## 2017-08-14 ENCOUNTER — Other Ambulatory Visit: Payer: 59

## 2017-08-14 ENCOUNTER — Encounter: Payer: Self-pay | Admitting: Obstetrics and Gynecology

## 2017-08-14 DIAGNOSIS — N390 Urinary tract infection, site not specified: Secondary | ICD-10-CM | POA: Diagnosis not present

## 2017-08-16 LAB — URINE CULTURE: ORGANISM ID, BACTERIA: NO GROWTH

## 2017-08-17 ENCOUNTER — Encounter: Payer: Self-pay | Admitting: Obstetrics and Gynecology

## 2017-08-21 ENCOUNTER — Encounter: Payer: Self-pay | Admitting: Obstetrics and Gynecology

## 2017-08-22 ENCOUNTER — Ambulatory Visit (INDEPENDENT_AMBULATORY_CARE_PROVIDER_SITE_OTHER): Payer: 59 | Admitting: Obstetrics and Gynecology

## 2017-08-22 ENCOUNTER — Encounter: Payer: Self-pay | Admitting: Obstetrics and Gynecology

## 2017-08-22 VITALS — BP 95/67 | HR 78 | Ht 64.0 in | Wt 254.8 lb

## 2017-08-22 DIAGNOSIS — Z01419 Encounter for gynecological examination (general) (routine) without abnormal findings: Secondary | ICD-10-CM

## 2017-08-22 DIAGNOSIS — F988 Other specified behavioral and emotional disorders with onset usually occurring in childhood and adolescence: Secondary | ICD-10-CM | POA: Diagnosis not present

## 2017-08-22 MED ORDER — FLUOXETINE HCL 20 MG PO CAPS: 20.0000 mg | ORAL_CAPSULE | Freq: Every day | ORAL | 4 refills | Status: DC

## 2017-08-22 NOTE — Patient Instructions (Signed)
Preventive Care 18-39 Years, Female Preventive care refers to lifestyle choices and visits with your health care provider that can promote health and wellness. What does preventive care include?  A yearly physical exam. This is also called an annual well check.  Dental exams once or twice a year.  Routine eye exams. Ask your health care provider how often you should have your eyes checked.  Personal lifestyle choices, including: ? Daily care of your teeth and gums. ? Regular physical activity. ? Eating a healthy diet. ? Avoiding tobacco and drug use. ? Limiting alcohol use. ? Practicing safe sex. ? Taking vitamin and mineral supplements as recommended by your health care provider. What happens during an annual well check? The services and screenings done by your health care provider during your annual well check will depend on your age, overall health, lifestyle risk factors, and family history of disease. Counseling Your health care provider may ask you questions about your:  Alcohol use.  Tobacco use.  Drug use.  Emotional well-being.  Home and relationship well-being.  Sexual activity.  Eating habits.  Work and work Statistician.  Method of birth control.  Menstrual cycle.  Pregnancy history.  Screening You may have the following tests or measurements:  Height, weight, and BMI.  Diabetes screening. This is done by checking your blood sugar (glucose) after you have not eaten for a while (fasting).  Blood pressure.  Lipid and cholesterol levels. These may be checked every 5 years starting at age 38.  Skin check.  Hepatitis C blood test.  Hepatitis B blood test.  Sexually transmitted disease (STD) testing.  BRCA-related cancer screening. This may be done if you have a family history of breast, ovarian, tubal, or peritoneal cancers.  Pelvic exam and Pap test. This may be done every 3 years starting at age 38. Starting at age 30, this may be done  every 5 years if you have a Pap test in combination with an HPV test.  Discuss your test results, treatment options, and if necessary, the need for more tests with your health care provider. Vaccines Your health care provider may recommend certain vaccines, such as:  Influenza vaccine. This is recommended every year.  Tetanus, diphtheria, and acellular pertussis (Tdap, Td) vaccine. You may need a Td booster every 10 years.  Varicella vaccine. You may need this if you have not been vaccinated.  HPV vaccine. If you are 39 or younger, you may need three doses over 6 months.  Measles, mumps, and rubella (MMR) vaccine. You may need at least one dose of MMR. You may also need a second dose.  Pneumococcal 13-valent conjugate (PCV13) vaccine. You may need this if you have certain conditions and were not previously vaccinated.  Pneumococcal polysaccharide (PPSV23) vaccine. You may need one or two doses if you smoke cigarettes or if you have certain conditions.  Meningococcal vaccine. One dose is recommended if you are age 68-21 years and a first-year college student living in a residence hall, or if you have one of several medical conditions. You may also need additional booster doses.  Hepatitis A vaccine. You may need this if you have certain conditions or if you travel or work in places where you may be exposed to hepatitis A.  Hepatitis B vaccine. You may need this if you have certain conditions or if you travel or work in places where you may be exposed to hepatitis B.  Haemophilus influenzae type b (Hib) vaccine. You may need this  if you have certain risk factors.  Talk to your health care provider about which screenings and vaccines you need and how often you need them. This information is not intended to replace advice given to you by your health care provider. Make sure you discuss any questions you have with your health care provider. Document Released: 05/28/2001 Document Revised:  12/20/2015 Document Reviewed: 01/31/2015 Elsevier Interactive Patient Education  2018 [employer]  
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
## 2017-08-23 LAB — LIPID PANEL
CHOL/HDL RATIO: 4.8 ratio — AB (ref 0.0–4.4)
Cholesterol, Total: 155 mg/dL (ref 100–199)
HDL: 32 mg/dL — AB (ref >39)
LDL Calculated: 97 mg/dL (ref 0–99)
Triglycerides: 129 mg/dL (ref 0–149)
VLDL Cholesterol Cal: 26 mg/dL (ref 5–40)

## 2017-08-23 LAB — COMPREHENSIVE METABOLIC PANEL
ALT: 16 IU/L (ref 0–32)
AST: 19 IU/L (ref 0–40)
Albumin/Globulin Ratio: 1.8 (ref 1.2–2.2)
Albumin: 4.4 g/dL (ref 3.5–5.5)
Alkaline Phosphatase: 110 IU/L (ref 39–117)
BILIRUBIN TOTAL: 0.3 mg/dL (ref 0.0–1.2)
BUN/Creatinine Ratio: 11 (ref 9–23)
BUN: 10 mg/dL (ref 6–20)
CHLORIDE: 102 mmol/L (ref 96–106)
CO2: 24 mmol/L (ref 20–29)
CREATININE: 0.89 mg/dL (ref 0.57–1.00)
Calcium: 9.4 mg/dL (ref 8.7–10.2)
GFR calc non Af Amer: 87 mL/min/{1.73_m2} (ref >59)
GFR, EST AFRICAN AMERICAN: 100 mL/min/{1.73_m2} (ref >59)
GLUCOSE: 88 mg/dL (ref 65–99)
Globulin, Total: 2.4 g/dL (ref 1.5–4.5)
Potassium: 4.4 mmol/L (ref 3.5–5.2)
Sodium: 138 mmol/L (ref 134–144)
TOTAL PROTEIN: 6.8 g/dL (ref 6.0–8.5)

## 2017-08-23 LAB — THYROID PANEL WITH TSH
Free Thyroxine Index: 1.7 (ref 1.2–4.9)
T3 UPTAKE RATIO: 23 % — AB (ref 24–39)
T4, Total: 7.3 ug/dL (ref 4.5–12.0)
TSH: 2.06 u[IU]/mL (ref 0.450–4.500)

## 2017-08-23 LAB — HEMOGLOBIN A1C
Est. average glucose Bld gHb Est-mCnc: 97 mg/dL
Hgb A1c MFr Bld: 5 % (ref 4.8–5.6)

## 2017-08-25 DIAGNOSIS — D2262 Melanocytic nevi of left upper limb, including shoulder: Secondary | ICD-10-CM | POA: Diagnosis not present

## 2017-08-25 DIAGNOSIS — D2261 Melanocytic nevi of right upper limb, including shoulder: Secondary | ICD-10-CM | POA: Diagnosis not present

## 2017-08-25 DIAGNOSIS — D2271 Melanocytic nevi of right lower limb, including hip: Secondary | ICD-10-CM | POA: Diagnosis not present

## 2017-09-05 ENCOUNTER — Encounter: Payer: Self-pay | Admitting: *Deleted

## 2017-09-05 ENCOUNTER — Other Ambulatory Visit: Payer: 59

## 2017-09-05 ENCOUNTER — Encounter: Payer: Self-pay | Admitting: Obstetrics and Gynecology

## 2017-09-05 DIAGNOSIS — R300 Dysuria: Secondary | ICD-10-CM

## 2017-09-07 LAB — URINE CULTURE

## 2017-09-10 ENCOUNTER — Ambulatory Visit (INDEPENDENT_AMBULATORY_CARE_PROVIDER_SITE_OTHER): Payer: 59 | Admitting: Obstetrics and Gynecology

## 2017-09-10 ENCOUNTER — Encounter: Payer: Self-pay | Admitting: Obstetrics and Gynecology

## 2017-09-10 VITALS — BP 105/61 | HR 93 | Ht 64.0 in | Wt 258.2 lb

## 2017-09-10 DIAGNOSIS — Z90710 Acquired absence of both cervix and uterus: Secondary | ICD-10-CM

## 2017-09-10 DIAGNOSIS — N809 Endometriosis, unspecified: Secondary | ICD-10-CM

## 2017-09-10 DIAGNOSIS — Z09 Encounter for follow-up examination after completed treatment for conditions other than malignant neoplasm: Secondary | ICD-10-CM

## 2017-09-10 DIAGNOSIS — Z90721 Acquired absence of ovaries, unilateral: Secondary | ICD-10-CM

## 2017-09-10 NOTE — Patient Instructions (Signed)
1.  Resume all activities without restriction 2.  Return in 1 year for annual examination

## 2017-09-10 NOTE — Progress Notes (Signed)
Chief complaint: 1.  Postop check 2.  Status post TAH right oophorectomy (07/28/2017)  Sarah Lawrence presents for final postop check following TAH right oophorectomy for management of chronic pelvic pain due to endometriosis.  Sarah Lawrence has a history of cesarean section x2, and is status post laparoscopic excision and fulguration of endometriosis and bilateral salpingectomy.  FINDINGS: Endometrioma within the preperitoneal fat just beneath the fascia on the right; this area was drained (chocolate cyst), and the scar tissue was cauterized.  The left ovary was normal.  Right ovary had endometriosis implants with scarring and attachment to the posterior aspect of the uterine fundus.  Because of endometriosis involving the right adnexa, decision was made to perform the right oophorectomy the uterus appeared grossly normal.  Fallopian tubes are surgically absent.  PATHOLOGY: DIAGNOSIS:  A. UTERUS WITH CERVIX AND RIGHT OVARY; HYSTERECTOMY WITH RIGHT  OOPHORECTOMY:  - CERVIX WITHIN NORMAL LIMITS.  - PROLIFERATIVE ENDOMETRIUM.  - OVARY WITH FOLLICULAR CYSTS AND FOCAL ENDOMETRIOSIS  Since surgery Sarah Lawrence has had return of normal activity levels.  Sarah Lawrence is experiencing normal bowel and bladder function.  Sarah Lawrence does not have any significant pelvic pain.  Sarah Lawrence did have intercourse several days ago which was associated with mild spotting without pain.  OBJECTIVE: BP 105/61   Pulse 93   Ht 5\' 4"  (1.626 m)   Wt 258 lb 3.2 oz (117.1 kg)   LMP 07/18/2017 (Exact Date)   BMI 44.32 kg/m   Pleasant well-appearing female no acute distress.  Alert and oriented. Back: No CVA tenderness Abdomen: Soft, nontender; no organomegaly; Pfannenstiel incision is well approximated and without evidence of hernia, left incision margin has small residual suture nidus remaining Pelvic: External genitalia-normal BUS-normal Vagina-excellent vaginal depth; good estrogen effect; vaginal cuff intact without evidence of granulation tissue; vaginal  cuff has some mild induration consistent with postsurgical changes Cervix-surgically absent Uterus-surgically absent Adnexa-nonpalpable nontender Rectovaginal-normal external exam  ASSESSMENT: 1.  Final postop check-normal 2.  Status post TAH right oophorectomy for symptomatic endometriosis  PLAN: 1.  Resume all activities without restriction 2.  Return in 1 year for annual examination.  Brayton Mars, MD  Note: This dictation was prepared with Dragon dictation along with smaller phrase technology. Any transcriptional errors that result from this process are unintentional.

## 2017-09-11 ENCOUNTER — Encounter: Payer: Self-pay | Admitting: Obstetrics and Gynecology

## 2017-09-24 ENCOUNTER — Encounter: Payer: Self-pay | Admitting: Obstetrics and Gynecology

## 2017-09-25 ENCOUNTER — Encounter: Payer: Self-pay | Admitting: Obstetrics and Gynecology

## 2017-09-25 ENCOUNTER — Telehealth: Payer: Self-pay | Admitting: Obstetrics and Gynecology

## 2017-09-25 ENCOUNTER — Other Ambulatory Visit: Payer: Self-pay

## 2017-09-25 ENCOUNTER — Other Ambulatory Visit: Payer: 59

## 2017-09-25 DIAGNOSIS — R300 Dysuria: Secondary | ICD-10-CM | POA: Diagnosis not present

## 2017-09-25 MED ORDER — FLUCONAZOLE 150 MG PO TABS: 150.0000 mg | ORAL_TABLET | Freq: Once | ORAL | 2 refills | Status: AC

## 2017-09-25 MED ORDER — NITROFURANTOIN MONOHYD MACRO 100 MG PO CAPS: 100.0000 mg | ORAL_CAPSULE | Freq: Two times a day (BID) | ORAL | 0 refills | Status: DC

## 2017-09-25 NOTE — Telephone Encounter (Signed)
The patient called and stated that she was tested in her office at work and it showed that she is positive for a yeast infection, The patient stated that she had burning and frequent urination with pain and blood in the urine as well. The patient also stated that she has bacteria in her urine as well. The patient is not able to see Dr. Tennis Must or Melody due to both of them being out, and is hoping to have something sent into her pharmacy as soon as possible. Please advise.

## 2017-09-25 NOTE — Telephone Encounter (Signed)
See My chart message

## 2017-09-27 LAB — URINE CULTURE

## 2017-09-30 ENCOUNTER — Encounter: Payer: Self-pay | Admitting: Obstetrics and Gynecology

## 2017-10-02 ENCOUNTER — Encounter: Payer: Self-pay | Admitting: Obstetrics and Gynecology

## 2017-10-02 ENCOUNTER — Ambulatory Visit: Payer: 59 | Admitting: Obstetrics and Gynecology

## 2017-10-02 VITALS — BP 90/56 | HR 90 | Ht 64.0 in | Wt 257.0 lb

## 2017-10-02 DIAGNOSIS — R300 Dysuria: Secondary | ICD-10-CM

## 2017-10-02 DIAGNOSIS — Z87440 Personal history of urinary (tract) infections: Secondary | ICD-10-CM | POA: Insufficient documentation

## 2017-10-02 DIAGNOSIS — N9489 Other specified conditions associated with female genital organs and menstrual cycle: Secondary | ICD-10-CM | POA: Insufficient documentation

## 2017-10-02 LAB — POCT URINALYSIS DIPSTICK
Bilirubin, UA: NEGATIVE
Blood, UA: NEGATIVE
Glucose, UA: NEGATIVE
KETONES UA: NEGATIVE
Leukocytes, UA: NEGATIVE
NITRITE UA: NEGATIVE
Odor: NEGATIVE
PH UA: 7 (ref 5.0–8.0)
PROTEIN UA: NEGATIVE
UROBILINOGEN UA: 0.2 U/dL

## 2017-10-02 NOTE — Progress Notes (Signed)
Chief complaint: 1.  Burning with urination 2.  Status post treatment for UTI 3.  Vulvar burning 4.  History of endometriosis  Sarah Lawrence presents today for evaluation.  She just completed a course of Macrobid x7 days for management of UTI.  He also had Diflucan tablet times one 1 week ago.  She is reportedly taking in good water intake along with cranberry juice.  She has noted persistent dysuria today and vaginal discomfort.  She denies vaginal bleeding or significant vaginal discharge. Shenae is status post TAH RSO for symptomatic endometriosis. Bowel function is according normal.  Past medical history, past surgical history, problem list, medications, and allergies are reviewed  OBJECTIVE: BP (!) 90/56   Pulse 90   Ht 5\' 4"  (1.626 m)   Wt 257 lb (116.6 kg)   LMP 07/18/2017 (Exact Date)   BMI 44.11 kg/m  Pleasant female no distress.  Alert and oriented. BACK: No CVA tenderness ABDOMEN: Soft, nontender; Pfannenstiel incision from hysterectomy is well-healed; mild suprapubic tenderness is noted overlying bladder PELVIC: External genitalia-normal BUS-normal Vagina-good vaginal vault support; minimal white secretions were in the vaginal vault Bimanual-no palpable mass or tenderness except for anterior vaginal wall slightly tender Rectal vaginal-normal external exam  PROCEDURE: Wet prep KOH-no yeast Normal saline-normal-appearing epithelial cells; no clue cells; no white blood cells; no trichomonas  ASSESSMENT: 1.  Status post UTI treatment over the past week 2.  Ongoing dysuria 3.  Vulvovaginal burning 4.  Negative wet prep  PLAN: 1.  Urinalysis-normal 2.  Urine culture sent 3.  Wet prep as noted 4.  Results will be made available urine culture within 72 hours 5.  Follow-up as needed if symptoms persist  A total of 15 minutes were spent face-to-face with the patient during this encounter and over half of that time dealt with counseling and coordination of care.  Brayton Mars, MD  Note: This dictation was prepared with Dragon dictation along with smaller phrase technology. Any transcriptional errors that result from this process are unintentional.

## 2017-10-02 NOTE — Patient Instructions (Signed)
1.  Urinalysis appears clear today 2.  Wet prep is negative for evidence of infection/vaginitis 3.  Urine culture is sent to rule out persistent UTI 4.  Continue intake of water and cranberry juice 5.  Follow-up as needed if symptoms persist

## 2017-10-03 ENCOUNTER — Other Ambulatory Visit: Payer: Self-pay | Admitting: Obstetrics and Gynecology

## 2017-10-03 MED ORDER — NITROFURANTOIN MONOHYD MACRO 100 MG PO CAPS: 100.0000 mg | ORAL_CAPSULE | Freq: Two times a day (BID) | ORAL | 1 refills | Status: DC

## 2017-10-03 NOTE — Addendum Note (Signed)
Addended by: Malachi Paradise A on: 10/03/2017 01:49 PM   Modules accepted: Orders

## 2017-10-03 NOTE — Progress Notes (Addendum)
Urine culture is pending from 10/02/2017 If persistent infection is identified, antibiotic will be prescribed

## 2017-10-04 ENCOUNTER — Ambulatory Visit
Admission: EM | Admit: 2017-10-04 | Discharge: 2017-10-04 | Disposition: A | Discharge status: Home / Self Care | Payer: 59 | Attending: Emergency Medicine | Admitting: Emergency Medicine

## 2017-10-04 DIAGNOSIS — R350 Frequency of micturition: Secondary | ICD-10-CM | POA: Diagnosis not present

## 2017-10-04 DIAGNOSIS — R300 Dysuria: Secondary | ICD-10-CM

## 2017-10-04 DIAGNOSIS — R3915 Urgency of urination: Secondary | ICD-10-CM | POA: Diagnosis not present

## 2017-10-04 DIAGNOSIS — N3001 Acute cystitis with hematuria: Secondary | ICD-10-CM | POA: Diagnosis not present

## 2017-10-04 LAB — URINALYSIS, COMPLETE (UACMP) WITH MICROSCOPIC
BILIRUBIN URINE: NEGATIVE
Glucose, UA: NEGATIVE mg/dL
KETONES UR: NEGATIVE mg/dL
NITRITE: NEGATIVE
PH: 6 (ref 5.0–8.0)
Protein, ur: NEGATIVE mg/dL
SPECIFIC GRAVITY, URINE: 1.015 (ref 1.005–1.030)

## 2017-10-04 LAB — URINE CULTURE

## 2017-10-04 MED ORDER — FLUCONAZOLE 150 MG PO TABS: 150.0000 mg | ORAL_TABLET | Freq: Once | ORAL | 1 refills | Status: AC

## 2017-10-04 MED ORDER — CEPHALEXIN 500 MG PO CAPS: 500.0000 mg | ORAL_CAPSULE | Freq: Two times a day (BID) | ORAL | 0 refills | Status: DC

## 2017-10-04 MED ORDER — PHENAZOPYRIDINE HCL 200 MG PO TABS: 200.0000 mg | ORAL_TABLET | Freq: Three times a day (TID) | ORAL | 0 refills | Status: DC | PRN

## 2017-10-04 NOTE — Discharge Instructions (Addendum)
We are sending your urine off for culture to confirm the presence of a UTI and to make sure that the Keflex is the right antibiotic for you.  Continue pushing fluids.  Take the Pyridium which will help your symptoms.    It will turn your urine orange will make you feel better.  Stop scented body washes. Follow-up with a primary care physician of your choice, see list below.  You also may need to see a urologist due to these recurrent UTIs.   Here is a list of primary care providers who are taking new patients:  Dr. Otilio Miu, Dr. Adline Potter [address] [place] [address] [place] [address]  [phone]  [facility] [address] [place], [place] [address] [phone]  [facility]  [phone] [place], [place] [address]  Here are clinics/ other resources who will see you if you do not have insurance. Some have certain criteria that you must meet. Call them and find out what they are:  [facility]: [address], [place], [place] [address] Phone: [phone] Hours: First and Third Saturdays of each Month, 9 a.m. - 1 p.m.  [facility]: [address], [place], [place], [place] [address] Phone: [phone] Hours: Tuesday, 4 p.m. - 8 p.m. Thursday, 1 p.m. - 8 p.m. Wednesday, 9 a.m. - [facility] [address], [place], [place] [address] Phone: [phone] Pharmacy Phone Number: [phone] Dental Phone Number: [phone] [place] Help: [phone]  Dental Hours: Monday - Thursday, 8 a.m. - 6 p.m.  [facility] [address], [place], [place] [address] Phone: [phone] Pharmacy Phone Number: [phone] [facility] Insurance Help: [phone]  [facility] [address] [place], [place] [address] Phone: [phone] Pharmacy Phone Number: [phone] [facility]  Insurance Help: [phone]  [facility] [address] [place], [place] [address] Phone: [phone] [facility] Insurance Help: [phone]   [address] [place], [place] [address] Phone: [phone]  Go to www.goodrx.com to look up your medications. This will give you a list of where you can find your prescriptions at the most affordable prices. Or ask the pharmacist what the cash price is, or if they have any other discount programs available to help make your medication more affordable. This can be less expensive than what you would pay with insurance.
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
## 2017-10-04 NOTE — ED Triage Notes (Signed)
4/15 hysterectomy. Since then has had 2 UTI's Saw PMD on Thursday and had cultures sent then.  Results supposed to come Monday, but symptoms are getting worse.

## 2017-10-06 ENCOUNTER — Encounter: Payer: Self-pay | Admitting: Obstetrics and Gynecology

## 2017-10-06 LAB — URINE CULTURE

## 2017-10-07 ENCOUNTER — Other Ambulatory Visit: Payer: Self-pay

## 2017-10-07 DIAGNOSIS — N390 Urinary tract infection, site not specified: Secondary | ICD-10-CM

## 2017-10-07 DIAGNOSIS — Z87440 Personal history of urinary (tract) infections: Secondary | ICD-10-CM

## 2017-10-30 ENCOUNTER — Encounter: Payer: Self-pay | Admitting: Obstetrics and Gynecology

## 2017-10-30 ENCOUNTER — Other Ambulatory Visit: Payer: Self-pay | Admitting: Obstetrics and Gynecology

## 2017-10-30 DIAGNOSIS — R102 Pelvic and perineal pain: Secondary | ICD-10-CM

## 2017-10-31 ENCOUNTER — Ambulatory Visit (INDEPENDENT_AMBULATORY_CARE_PROVIDER_SITE_OTHER): Payer: 59

## 2017-10-31 DIAGNOSIS — R102 Pelvic and perineal pain: Secondary | ICD-10-CM | POA: Diagnosis not present

## 2017-10-31 DIAGNOSIS — N83202 Unspecified ovarian cyst, left side: Secondary | ICD-10-CM

## 2017-11-03 ENCOUNTER — Encounter: Payer: Self-pay | Admitting: Obstetrics and Gynecology

## 2017-11-04 ENCOUNTER — Ambulatory Visit (INDEPENDENT_AMBULATORY_CARE_PROVIDER_SITE_OTHER): Payer: 59 | Admitting: Obstetrics and Gynecology

## 2017-11-04 ENCOUNTER — Encounter: Payer: Self-pay | Admitting: Obstetrics and Gynecology

## 2017-11-04 VITALS — BP 113/69 | HR 60 | Ht 65.0 in | Wt 253.4 lb

## 2017-11-04 DIAGNOSIS — N83202 Unspecified ovarian cyst, left side: Secondary | ICD-10-CM | POA: Diagnosis not present

## 2017-11-04 DIAGNOSIS — R1032 Left lower quadrant pain: Secondary | ICD-10-CM

## 2017-11-04 DIAGNOSIS — N809 Endometriosis, unspecified: Secondary | ICD-10-CM

## 2017-11-04 DIAGNOSIS — Z90710 Acquired absence of both cervix and uterus: Secondary | ICD-10-CM | POA: Diagnosis not present

## 2017-11-04 DIAGNOSIS — Z90721 Acquired absence of ovaries, unilateral: Secondary | ICD-10-CM

## 2017-11-04 NOTE — Patient Instructions (Signed)
1.  Repeat ultrasound in 6 weeks 2.  Return in 8 weeks for follow-up 3.  Medication for pain from ovarian cyst includes the following:  1000 mg Tylenol every 6 hours  600 mg ibuprofen every 6 hours (may take medications simultaneously) 4.  Notify the office if severe colicky pain develops or if the pain is not controlled with analgesics.

## 2017-11-04 NOTE — Progress Notes (Signed)
Pt is here to review test results.  Chief complaint: 1.  Ultrasound reviewed 2.  Left lower quadrant pain 3.  History of endometriosis 4.  Status post TAH right oophorectomy  Sarah Lawrence presents for follow-up on ultrasound and management of left lower quadrant pelvic pain. Pelvic pain symptoms began shortly after June 1. Pain is described as shooting pains originating in the left lower quadrant with radiation to the left flank. She reports no fever, chills, sweats, dysuria, urgency.  She does report increased urinary frequency.  She has been treated for several UTIs since her hysterectomy. Sarah Lawrence is status post TAH right oophorectomy for symptomatic endometriosis of the right ovary-pathology confirmed.  Review of ultrasound: Location: [facility] Date of Service:  10/31/2017   Indications: Pain; S/P hysterectomy and right oophorectomy Findings:  The uterus is surgically absent.  Right Ovary is surgically absent Left Ovary measures 6.1 x 6.0 x 5.1 cm and contains two cystic structures vs one large, septated cyst. The first cyst measures 3.8 x 5.2 x 4.3 cm and the second measures 5.8 x 2.6 x 3.4 cm, or combined as a septated cyst measures 5.5 x 4.9 x 5.1 cm. Survey of the adnexa demonstrates no adnexal masses. There is no free fluid in the cul de sac.  Impression: 1. S/P hysterectomy and right oophorectomy. 2. Left ovary is enlarged at 6.1 x 6.0 x 5.1 cm. 3. Two cystic structures vs one septated cyst noted on left ovary.  Past medical history, past surgical history, problem list, medications, and allergies are reviewed  OBJECTIVE: BP 113/69   Pulse 60   Ht 5\' 5"  (1.651 m)   Wt 253 lb 7 oz (115 kg)   LMP 07/18/2017 (Exact Date)   BMI 42.17 kg/m  Pleasant female in no acute distress.  Alert and oriented.  Affect is appropriate. Physical exam-deferred  ASSESSMENT: 1.  Left ovarian cyst, possibly septated cyst versus 2 large simple cysts, symptomatic, nonacute 2.   History of endometriosis 3.  Status post laparoscopy with bilateral salpingectomies; status post TAH Rt oophorectomy 4.  Urinary frequency likely secondary to ovarian cyst  PLAN: 1.  Recommend acetaminophen 1000 mg 4 times daily and ibuprofen 600 mg 4 times daily (medications may be taken simultaneously) 2.  Repeat ultrasound in 6 weeks 3.  Follow-up in 8 weeks 4.  Return sooner if symptoms are not improved with analgesics or if colicky pain develops.  Patient does understand that she is at risk for ovarian torsion and symptom complex was reviewed.  A total of 15 minutes were spent face-to-face with the patient during this encounter and over half of that time dealt with counseling and coordination of care.  Brayton Mars, MD  Note: This dictation was prepared with Dragon dictation along with smaller phrase technology. Any transcriptional errors that result from this process are unintentional.

## 2017-11-06 ENCOUNTER — Other Ambulatory Visit: Payer: Self-pay | Admitting: Family Medicine

## 2017-11-10 ENCOUNTER — Ambulatory Visit: Payer: Self-pay | Admitting: Urology

## 2017-11-12 ENCOUNTER — Other Ambulatory Visit: Payer: Self-pay | Admitting: Obstetrics and Gynecology

## 2017-11-13 ENCOUNTER — Encounter: Payer: Self-pay | Admitting: Obstetrics and Gynecology

## 2017-12-08 ENCOUNTER — Other Ambulatory Visit: Payer: Self-pay | Admitting: *Deleted

## 2017-12-08 MED ORDER — LISDEXAMFETAMINE DIMESYLATE 70 MG PO CAPS
70.0000 mg | ORAL_CAPSULE | ORAL | 0 refills | Status: DC
Start: 2017-12-08 — End: 2018-06-09

## 2017-12-09 ENCOUNTER — Encounter: Payer: Self-pay | Admitting: *Deleted

## 2017-12-16 ENCOUNTER — Ambulatory Visit (INDEPENDENT_AMBULATORY_CARE_PROVIDER_SITE_OTHER): Payer: 59

## 2017-12-16 ENCOUNTER — Encounter: Payer: Self-pay | Admitting: Obstetrics and Gynecology

## 2017-12-16 DIAGNOSIS — R1032 Left lower quadrant pain: Secondary | ICD-10-CM | POA: Diagnosis not present

## 2017-12-16 DIAGNOSIS — N83202 Unspecified ovarian cyst, left side: Secondary | ICD-10-CM | POA: Diagnosis not present

## 2017-12-30 ENCOUNTER — Encounter: Payer: Self-pay | Admitting: Obstetrics and Gynecology

## 2018-01-01 ENCOUNTER — Other Ambulatory Visit: Payer: Self-pay | Admitting: Obstetrics and Gynecology

## 2018-01-01 MED ORDER — ARIPIPRAZOLE 2 MG PO TABS: 2.0000 mg | ORAL_TABLET | Freq: Every day | ORAL | 4 refills | Status: DC

## 2018-01-01 NOTE — Progress Notes (Unsigned)
abili

## 2018-01-05 ENCOUNTER — Other Ambulatory Visit: Payer: 59

## 2018-01-05 ENCOUNTER — Other Ambulatory Visit: Payer: Self-pay | Admitting: *Deleted

## 2018-01-05 DIAGNOSIS — Z87440 Personal history of urinary (tract) infections: Secondary | ICD-10-CM

## 2018-01-05 MED ORDER — FLUCONAZOLE 150 MG PO TABS: 150.0000 mg | ORAL_TABLET | Freq: Once | ORAL | 2 refills | Status: AC

## 2018-01-05 MED ORDER — NITROFURANTOIN MONOHYD MACRO 100 MG PO CAPS: 100.0000 mg | ORAL_CAPSULE | Freq: Two times a day (BID) | ORAL | 0 refills | Status: DC

## 2018-01-05 MED ORDER — CEPHALEXIN 500 MG PO CAPS: 500.0000 mg | ORAL_CAPSULE | Freq: Two times a day (BID) | ORAL | 0 refills | Status: DC

## 2018-01-07 ENCOUNTER — Encounter: Payer: Self-pay | Admitting: *Deleted

## 2018-01-07 LAB — URINE CULTURE: Organism ID, Bacteria: NO GROWTH

## 2018-01-15 ENCOUNTER — Encounter: Payer: Self-pay | Admitting: Obstetrics and Gynecology

## 2018-01-15 ENCOUNTER — Ambulatory Visit (INDEPENDENT_AMBULATORY_CARE_PROVIDER_SITE_OTHER): Payer: 59 | Admitting: Obstetrics and Gynecology

## 2018-01-15 ENCOUNTER — Other Ambulatory Visit: Payer: Self-pay | Admitting: *Deleted

## 2018-01-15 VITALS — BP 90/56 | HR 88 | Ht 65.0 in | Wt 255.8 lb

## 2018-01-15 DIAGNOSIS — R102 Pelvic and perineal pain: Secondary | ICD-10-CM | POA: Diagnosis not present

## 2018-01-15 DIAGNOSIS — N83202 Unspecified ovarian cyst, left side: Secondary | ICD-10-CM

## 2018-01-15 DIAGNOSIS — N809 Endometriosis, unspecified: Secondary | ICD-10-CM

## 2018-01-15 MED ORDER — ELAGOLIX SODIUM 150 MG PO TABS: 150.0000 mg | ORAL_TABLET | Freq: Every day | ORAL | 1 refills | Status: DC

## 2018-01-15 MED ORDER — ALPRAZOLAM 0.5 MG PO TABS: 0.5000 mg | ORAL_TABLET | Freq: Two times a day (BID) | ORAL | 1 refills | Status: DC | PRN

## 2018-01-15 NOTE — Progress Notes (Signed)
Chief complaint: 1.  Follow-up on ultrasound 2.  Endometriosis 3.  Pelvic pain  32 year old female status post TAH RSO for symptomatic endometriosis, with history of recurrent left lower quadrant pelvic pain and intermittent dyspareunia, presents for follow-up.  Pelvic ultrasound on 12/16/2017 demonstrates persistent complex left ovarian cyst (smaller).  Sarah Lawrence states that her pelvic pain is much improved and tolerable.  She does note some deep thrusting dyspareunia when she is on top position and occasionally has some vaginal spotting.  Bowel function is normal.   Bladder function is normal.  Past medical history, past surgeries, problem list, medications, and allergies are reviewed  OBJECTIVE: BP (!) 90/56   Pulse 88   Ht 5\' 5"  (1.651 m)   Wt 255 lb 12.8 oz (116 kg)   LMP 07/18/2017 (Exact Date)   BMI 42.57 kg/m  Pleasant female no acute distress.  Alert and oriented. Back: No CVA tenderness Abdomen: Soft, nontender without organomegaly; Pfannenstiel incision is well-healed without evidence of hernia; laparoscopy incisions are well-healed without hernia. Pelvic exam: External genitalia-normal BUS-normal Vagina-good estrogen effect; good vault support; no abnormal lesions seen Bimanual-tenderness in the vaginal cuff with slight scarring irregularity on the left side of the cuff which is 2/4 tender; left adnexa is 2/4 tender without palpable mass; right adnexa nontender Rectovaginal-normal external exam  ASSESSMENT: 1.  History of endometriosis, intermittently symptomatic with deep thrusting dyspareunia and left lower quadrant pain which waxes and wanes 2.  Status post TAH RSO 3.  2 left ovarian cyst 2.9 cm and 1.8 cm (both smaller) identified on ultrasound from 12/16/2017.  PLAN: 1.  Trial of Orilissa 150 mg daily 2.  Return in 6 weeks for follow-up 3.  Probable repeat pelvic ultrasound in 6 months or sooner if symptoms flare.  A total of 15 minutes were spent face-to-face with  the patient during this encounter and over half of that time dealt with counseling and coordination of care.  Brayton Mars, MD  Note: This dictation was prepared with Dragon dictation along with smaller phrase technology. Any transcriptional errors that result from this process are unintentional.

## 2018-01-15 NOTE — Patient Instructions (Addendum)
1.  Begin Orilissa 150 mg daily 2.  Return in 6 weeks for follow-up 3.  Literature is given regarding medication

## 2018-01-19 ENCOUNTER — Telehealth: Payer: Self-pay

## 2018-01-19 NOTE — Telephone Encounter (Signed)
Pt wants to know if she needs to continue with medication for the endometriosis. She doesn't want to keep taking meds forever. Do you have any other options for her?

## 2018-01-20 NOTE — Telephone Encounter (Signed)
LMTRC

## 2018-01-20 NOTE — Telephone Encounter (Signed)
Pt aware mad recommendation. She is aware Freida Busman is covered. Pt will think about it and call us back.

## 2018-01-22 ENCOUNTER — Other Ambulatory Visit: Payer: Self-pay | Admitting: Obstetrics and Gynecology

## 2018-01-22 MED ORDER — FLUOXETINE HCL 10 MG PO CAPS: 10.0000 mg | ORAL_CAPSULE | Freq: Every day | ORAL | 2 refills | Status: DC

## 2018-01-27 ENCOUNTER — Ambulatory Visit (INDEPENDENT_AMBULATORY_CARE_PROVIDER_SITE_OTHER): Payer: 59 | Admitting: Obstetrics and Gynecology

## 2018-01-27 ENCOUNTER — Encounter: Payer: Self-pay | Admitting: Obstetrics and Gynecology

## 2018-01-27 VITALS — BP 104/64 | HR 82 | Ht 65.0 in | Wt 253.7 lb

## 2018-01-27 DIAGNOSIS — N83202 Unspecified ovarian cyst, left side: Secondary | ICD-10-CM

## 2018-01-27 DIAGNOSIS — N809 Endometriosis, unspecified: Secondary | ICD-10-CM | POA: Diagnosis not present

## 2018-01-27 DIAGNOSIS — Z90721 Acquired absence of ovaries, unilateral: Secondary | ICD-10-CM

## 2018-01-27 DIAGNOSIS — Z90710 Acquired absence of both cervix and uterus: Secondary | ICD-10-CM | POA: Diagnosis not present

## 2018-01-27 DIAGNOSIS — Z9889 Other specified postprocedural states: Secondary | ICD-10-CM

## 2018-01-27 DIAGNOSIS — R102 Pelvic and perineal pain: Secondary | ICD-10-CM

## 2018-01-27 NOTE — Patient Instructions (Signed)
1.  Laparoscopic left oophorectomy is scheduled 2.  Return week before surgery for preop appointment

## 2018-01-28 NOTE — Progress Notes (Signed)
Chief complaint: 1.  Management planning conference 2.  Symptomatic endometriosis  Sarah Lawrence presents today for 15-minute conference to discuss further management of chronic pelvic pain secondary to endometriosis. Patient is status post TAH right oophorectomy for symptomatic endometriosis.  At the time of surgery left ovary appeared normal.  However since surgery patient has had persistent chronic left lower quadrant pain which has been incapacitating and affecting activities of daily living.  We have discussed ongoing medical versus surgical management of her chronic pelvic pain.Sarah Lawrence therapy has been offered but declined.  Other similar alternative medical therapies of endometriosis are declined at this time.  Patient is very much interested in definitive surgical management.  The impact of surgical menopause has been reviewed with multiple questions answered. Sarah Lawrence does understand that with removal of the second ovary, she will be surgically menopausal, and will be at risk for development of osteoporosis, vasomotor symptoms, and urogenital atrophy.  She understands that estrogen replacement therapy can assist in minimizing risks of osteoporosis, can assist in eliminating vasomotor symptoms, and can assist in improvement of urogenital atrophy symptoms. However, in addition the patient does understand that patient is to undergo surgical menopause early have a risk of having potentially shorter life span due to impact and complications from cardiovascular disease.  In light of all factors, the patient does wish to proceed with laparoscopic oophorectomy.  She is willing to take estrogen replacement therapy in order to help minimize potential side effects from surgical menopause.  She also is willing to have heightened awareness about lifestyle modifications will which will be necessary in order to minimize risk of cardiovascular disease in the future.  ASSESSMENT: 1.  Symptomatic endometriosis 2.   Status post TAH right oophorectomy 3.  Left ovarian cyst 4.  Desire for surgical management through laparoscopic left oophorectomy 5.  Patient is willing to proceed with ERT following surgery.  PLAN: 1.  Laparoscopic left oophorectomy as scheduled 2.  Patient will return for preop appointment the week before surgery  A total of 15 minutes were spent face-to-face with the patient during this encounter and over half of that time dealt with counseling and coordination of care.  Brayton Mars, MD  Note: This dictation was prepared with Dragon dictation along with smaller phrase technology. Any transcriptional errors that result from this process are unintentional.

## 2018-02-02 ENCOUNTER — Ambulatory Visit: Payer: Self-pay | Admitting: Urology

## 2018-02-05 ENCOUNTER — Encounter: Payer: Self-pay | Admitting: Obstetrics and Gynecology

## 2018-02-09 ENCOUNTER — Other Ambulatory Visit: Payer: 59

## 2018-02-10 DIAGNOSIS — M5127 Other intervertebral disc displacement, lumbosacral region: Secondary | ICD-10-CM | POA: Diagnosis not present

## 2018-02-10 DIAGNOSIS — M62830 Muscle spasm of back: Secondary | ICD-10-CM | POA: Diagnosis not present

## 2018-02-10 DIAGNOSIS — M9903 Segmental and somatic dysfunction of lumbar region: Secondary | ICD-10-CM | POA: Diagnosis not present

## 2018-02-14 ENCOUNTER — Other Ambulatory Visit: Payer: Self-pay | Admitting: Obstetrics and Gynecology

## 2018-02-16 ENCOUNTER — Ambulatory Visit: Admit: 2018-02-16 | Payer: 59 | Admitting: Obstetrics and Gynecology

## 2018-02-16 SURGERY — SALPINGO-OOPHORECTOMY, BILATERAL, LAPAROSCOPIC
Anesthesia: General | Laterality: Left

## 2018-02-18 ENCOUNTER — Other Ambulatory Visit: Payer: Self-pay | Admitting: Obstetrics and Gynecology

## 2018-02-18 MED ORDER — SERTRALINE HCL 25 MG PO TABS: 25.0000 mg | ORAL_TABLET | Freq: Every day | ORAL | 6 refills | Status: DC

## 2018-02-18 MED ORDER — HYDROXYZINE HCL 25 MG PO TABS: 25.0000 mg | ORAL_TABLET | Freq: Three times a day (TID) | ORAL | 0 refills | Status: DC | PRN

## 2018-02-19 DIAGNOSIS — M62830 Muscle spasm of back: Secondary | ICD-10-CM | POA: Diagnosis not present

## 2018-02-19 DIAGNOSIS — M5127 Other intervertebral disc displacement, lumbosacral region: Secondary | ICD-10-CM | POA: Diagnosis not present

## 2018-02-19 DIAGNOSIS — M9903 Segmental and somatic dysfunction of lumbar region: Secondary | ICD-10-CM | POA: Diagnosis not present

## 2018-02-26 ENCOUNTER — Encounter: Payer: Self-pay | Admitting: Obstetrics and Gynecology

## 2018-02-26 ENCOUNTER — Ambulatory Visit: Payer: 59 | Admitting: Obstetrics and Gynecology

## 2018-02-26 VITALS — BP 109/74 | Ht 65.0 in | Wt 257.8 lb

## 2018-02-26 DIAGNOSIS — Z90710 Acquired absence of both cervix and uterus: Secondary | ICD-10-CM | POA: Diagnosis not present

## 2018-02-26 DIAGNOSIS — Z87440 Personal history of urinary (tract) infections: Secondary | ICD-10-CM

## 2018-02-26 DIAGNOSIS — N809 Endometriosis, unspecified: Secondary | ICD-10-CM

## 2018-02-26 DIAGNOSIS — Z90721 Acquired absence of ovaries, unilateral: Secondary | ICD-10-CM

## 2018-02-26 DIAGNOSIS — R1032 Left lower quadrant pain: Secondary | ICD-10-CM | POA: Diagnosis not present

## 2018-02-26 DIAGNOSIS — Z79899 Other long term (current) drug therapy: Secondary | ICD-10-CM

## 2018-02-26 NOTE — Patient Instructions (Addendum)
1.  Continue taking oralissa 150 mg daily 2.  Recommend calcium 600 mg twice a day and vitamin D 400 international units twice a day 3.  Recommend weightbearing exercises 30 minutes a day 5 days a week 4.  Resume annual exams with Melody Shambley in January 2020 5.  Follow-up with Dr. Marcelline Mates in June 2024 medication management regarding endometriosis. 6.  If bladder pain persists over the next month, consider follow-up with urology.  (Should urinalysis and urine culture be negative for infection)

## 2018-02-26 NOTE — Progress Notes (Signed)
Chief complaint: 1.  Endometriosis management 2.  Medication management 3.  Chronic left lower quadrant pain 4.  Bladder pain  Sarah Lawrence presents today for follow-up.  She is now on oral Alyssa for the past 4 weeks for management of left lower quadrant pain thought to be secondary to endometriosis.  She has previously undergone hysterectomy with right oophorectomy for symptomatic endometriosis.  She states that her pain has dramatically diminished over the past 4 weeks since starting the medication.  The level of pain has gone from 7-8/10 to a 1-2/10. Other symptoms include bladder pain with urination; she did do a urine dipstick and culture at work and these were negative for infection.  She states that she had similar symptoms prior to and shortly after her hysterectomy however, the symptoms went away.  They have now recurred.  She has not had a urology evaluation.  Patient is desiring to resume annual exams with Melody Shambley.  She understands that we still recommend follow-up with physician for monitoring of her endometriosis/medication.  This will be done at 37-month intervals.  She has opted to see Dr. Marcelline Mates.  Past medical history, past surgeries, problem list, medications, and allergies are reviewed  OBJECTIVE: BP 109/74   Ht 5\' 5"  (1.651 m)   Wt 257 lb 12.8 oz (116.9 kg)   LMP 07/18/2017 (Exact Date)   BMI 42.90 kg/m  Pleasant female no acute distress.  Alert and oriented.  Affect is appropriate. Abdomen: Soft, nontender without organomegaly Pelvic exam: External genitalia-normal BUS-normal Bimanual-good vaginal vault support; no significant mass or tenderness at the cuff or in the right and left lower quadrants.  (Patient states that this has been the least painful exam she has had in a long time regarding pelvic pain)  ASSESSMENT: 1.  Endometriosis, less symptomatic since starting Oralissa therapy 150 mg daily 1 month ago. 2.  Bladder pain, unclear etiology; recent UA C&S  negative for infection 3.  Status post hysterectomy right oophorectomy  PLAN: 1.  Continue Oralissa 150 mg daily 2.  Start calcium 600 mg twice a day and vitamin D 400 international units twice a day 3.  Weightbearing exercise 30 minutes daily 5 days a week is encouraged 4.  Resume annual exams with Melody Shambley beginning in January 2020 5.  Alternate visits with Dr. Marcelline Mates in June 2024 monitoring of endometriosis symptoms and Oralissa medication.  A total of 15 minutes were spent face-to-face with the patient during this encounter and over half of that time dealt with counseling and coordination of care.  Brayton Mars, MD  Note: This dictation was prepared with Dragon dictation along with smaller phrase technology. Any transcriptional errors that result from this process are unintentional.

## 2018-03-04 ENCOUNTER — Other Ambulatory Visit: Payer: Self-pay | Admitting: Obstetrics and Gynecology

## 2018-03-04 MED ORDER — HYDROXYZINE HCL 50 MG PO TABS: 25.0000 mg | ORAL_TABLET | Freq: Three times a day (TID) | ORAL | 4 refills | Status: DC | PRN

## 2018-03-04 MED ORDER — SERTRALINE HCL 50 MG PO TABS: 25.0000 mg | ORAL_TABLET | Freq: Every day | ORAL | 6 refills | Status: DC

## 2018-03-05 ENCOUNTER — Other Ambulatory Visit: Payer: Self-pay | Admitting: *Deleted

## 2018-03-05 MED ORDER — SERTRALINE HCL 50 MG PO TABS: 25.0000 mg | ORAL_TABLET | Freq: Every day | ORAL | 6 refills | Status: DC

## 2018-03-10 ENCOUNTER — Other Ambulatory Visit: Payer: Self-pay | Admitting: *Deleted

## 2018-03-10 MED ORDER — SERTRALINE HCL 50 MG PO TABS: 50.0000 mg | ORAL_TABLET | Freq: Every day | ORAL | 6 refills | Status: DC

## 2018-03-11 ENCOUNTER — Other Ambulatory Visit: Payer: Self-pay | Admitting: *Deleted

## 2018-03-11 MED ORDER — HYDROXYZINE HCL 50 MG PO TABS: 50.0000 mg | ORAL_TABLET | Freq: Three times a day (TID) | ORAL | 4 refills | Status: DC | PRN

## 2018-03-16 ENCOUNTER — Other Ambulatory Visit: Payer: Self-pay | Admitting: Obstetrics and Gynecology

## 2018-03-20 ENCOUNTER — Other Ambulatory Visit: Payer: Self-pay

## 2018-03-23 ENCOUNTER — Other Ambulatory Visit: Payer: Self-pay | Admitting: *Deleted

## 2018-03-23 MED ORDER — LISDEXAMFETAMINE DIMESYLATE 70 MG PO CAPS: 70.0000 mg | ORAL_CAPSULE | Freq: Every day | ORAL | 0 refills | Status: DC

## 2018-04-03 ENCOUNTER — Ambulatory Visit: Payer: 59 | Admitting: Urology

## 2018-04-03 ENCOUNTER — Encounter: Payer: Self-pay | Admitting: Urology

## 2018-04-03 VITALS — BP 119/85 | HR 72 | Ht 65.0 in | Wt 260.0 lb

## 2018-04-03 DIAGNOSIS — Z8742 Personal history of other diseases of the female genital tract: Secondary | ICD-10-CM

## 2018-04-03 DIAGNOSIS — R3989 Other symptoms and signs involving the genitourinary system: Secondary | ICD-10-CM

## 2018-04-03 LAB — BLADDER SCAN AMB NON-IMAGING
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
## 2018-04-13 ENCOUNTER — Other Ambulatory Visit: Payer: Self-pay | Admitting: *Deleted

## 2018-04-13 MED ORDER — SERTRALINE HCL 50 MG PO TABS: 50.0000 mg | ORAL_TABLET | Freq: Every day | ORAL | 6 refills | Status: DC

## 2018-04-22 ENCOUNTER — Other Ambulatory Visit: Payer: Self-pay | Admitting: Obstetrics and Gynecology

## 2018-04-22 MED ORDER — ARIPIPRAZOLE 5 MG PO TABS: 5.0000 mg | ORAL_TABLET | Freq: Every day | ORAL | 6 refills | Status: DC

## 2018-04-24 ENCOUNTER — Encounter: Payer: Self-pay | Admitting: Obstetrics and Gynecology

## 2018-04-29 DIAGNOSIS — J0140 Acute pansinusitis, unspecified: Secondary | ICD-10-CM | POA: Diagnosis not present

## 2018-05-04 ENCOUNTER — Other Ambulatory Visit: Payer: Self-pay | Admitting: *Deleted

## 2018-05-04 MED ORDER — LISDEXAMFETAMINE DIMESYLATE 70 MG PO CAPS: 70.0000 mg | ORAL_CAPSULE | Freq: Every day | ORAL | 0 refills | Status: DC

## 2018-05-05 ENCOUNTER — Encounter: Payer: Self-pay | Admitting: Obstetrics and Gynecology

## 2018-05-05 ENCOUNTER — Encounter: Payer: Self-pay | Admitting: *Deleted

## 2018-05-13 ENCOUNTER — Other Ambulatory Visit: Payer: Self-pay | Admitting: Obstetrics and Gynecology

## 2018-05-13 ENCOUNTER — Other Ambulatory Visit: Payer: Self-pay | Admitting: *Deleted

## 2018-05-13 MED ORDER — ARIPIPRAZOLE 10 MG PO TABS: 5.0000 mg | ORAL_TABLET | Freq: Two times a day (BID) | ORAL | 6 refills | Status: DC

## 2018-05-13 MED ORDER — ARIPIPRAZOLE 10 MG PO TABS: 5.0000 mg | ORAL_TABLET | Freq: Every day | ORAL | 6 refills | Status: DC

## 2018-05-14 ENCOUNTER — Other Ambulatory Visit: Payer: Self-pay

## 2018-05-14 DIAGNOSIS — R3989 Other symptoms and signs involving the genitourinary system: Secondary | ICD-10-CM

## 2018-05-15 ENCOUNTER — Other Ambulatory Visit
Admission: RE | Admit: 2018-05-15 | Discharge: 2018-05-15 | Disposition: A | Discharge status: Home / Self Care | Payer: 59 | Attending: Urology | Admitting: Urology

## 2018-05-15 ENCOUNTER — Encounter: Payer: Self-pay | Admitting: Urology

## 2018-05-15 ENCOUNTER — Ambulatory Visit: Payer: 59 | Admitting: Urology

## 2018-05-15 VITALS — BP 107/62 | HR 77 | Ht 65.0 in | Wt 260.0 lb

## 2018-05-15 DIAGNOSIS — R3989 Other symptoms and signs involving the genitourinary system: Secondary | ICD-10-CM | POA: Insufficient documentation

## 2018-05-15 DIAGNOSIS — Z8742 Personal history of other diseases of the female genital tract: Secondary | ICD-10-CM

## 2018-05-15 LAB — URINALYSIS, COMPLETE (UACMP) WITH MICROSCOPIC
Bilirubin Urine: NEGATIVE
Glucose, UA: NEGATIVE mg/dL
Hgb urine dipstick: NEGATIVE
Ketones, ur: NEGATIVE mg/dL
Leukocytes, UA: NEGATIVE
Nitrite: NEGATIVE
Protein, ur: NEGATIVE mg/dL
RBC / HPF: NONE SEEN RBC/hpf (ref 0–5)
Specific Gravity, Urine: 1.02 (ref 1.005–1.030)
pH: 6 (ref 5.0–8.0)

## 2018-05-15 NOTE — Progress Notes (Signed)
   05/15/18  CC:  Chief Complaint  Patient presents with  . Cysto    HPI: 33 year old female who presents today for cystoscopy for further evaluation of bladder pain.  Overall, her symptoms are slowly improving.  Please see previous note on 04/03/2018 for further details.  UA today is negative.    Last menstrual period 07/18/2017. NED. A&Ox3.   No respiratory distress   Abd soft, NT, ND Normal external genitalia with patent urethral meatus  Cystoscopy Procedure Note  Patient identification was confirmed, informed consent was obtained, and patient was prepped using Betadine solution.  Lidocaine jelly was administered per urethral meatus.    Procedure: - Flexible cystoscope introduced, without any difficulty.   - Thorough search of the bladder revealed:    normal urethral meatus    normal urothelium    no stones    no ulcers     no tumors    no urethral polyps    no trabeculation  - Ureteral orifices were normal in position and appearance.  Post-Procedure: - Patient tolerated the procedure well  Assessment/ Plan:  1. Bladder pain Bladder mucosa grossly normal today No signs of mucosal pathology following the lumen of the bladder Patient was reassured  Advised to return if symptoms fail to improve or worsen  2. History of endometriosis F/u with GYN  F/u prn  Hollice Espy, MD

## 2018-06-02 ENCOUNTER — Other Ambulatory Visit: Payer: Self-pay | Admitting: Obstetrics and Gynecology

## 2018-06-02 MED ORDER — ARIPIPRAZOLE 15 MG PO TABS: 15.0000 mg | ORAL_TABLET | Freq: Two times a day (BID) | ORAL | 3 refills | Status: DC

## 2018-06-09 ENCOUNTER — Encounter: Payer: Self-pay | Admitting: Obstetrics and Gynecology

## 2018-06-09 ENCOUNTER — Ambulatory Visit (INDEPENDENT_AMBULATORY_CARE_PROVIDER_SITE_OTHER): Payer: 59 | Admitting: Obstetrics and Gynecology

## 2018-06-09 ENCOUNTER — Other Ambulatory Visit (HOSPITAL_COMMUNITY)
Admission: RE | Admit: 2018-06-09 | Discharge: 2018-06-09 | Disposition: A | Discharge status: Home / Self Care | Payer: 59 | Source: Ambulatory Visit | Attending: Obstetrics and Gynecology | Admitting: Obstetrics and Gynecology

## 2018-06-09 VITALS — BP 97/67 | HR 78 | Ht 65.0 in | Wt 264.2 lb

## 2018-06-09 DIAGNOSIS — F988 Other specified behavioral and emotional disorders with onset usually occurring in childhood and adolescence: Secondary | ICD-10-CM | POA: Diagnosis not present

## 2018-06-09 DIAGNOSIS — N9412 Deep dyspareunia: Secondary | ICD-10-CM | POA: Diagnosis not present

## 2018-06-09 DIAGNOSIS — G479 Sleep disorder, unspecified: Secondary | ICD-10-CM | POA: Diagnosis not present

## 2018-06-09 DIAGNOSIS — Z01419 Encounter for gynecological examination (general) (routine) without abnormal findings: Secondary | ICD-10-CM

## 2018-06-09 DIAGNOSIS — F331 Major depressive disorder, recurrent, moderate: Secondary | ICD-10-CM

## 2018-06-09 MED ORDER — LISDEXAMFETAMINE DIMESYLATE 70 MG PO CAPS: 70.0000 mg | ORAL_CAPSULE | ORAL | 0 refills | Status: DC

## 2018-06-09 MED ORDER — HYDROXYZINE HCL 50 MG PO TABS: 100.0000 mg | ORAL_TABLET | Freq: Every day | ORAL | 4 refills | Status: DC

## 2018-06-09 MED ORDER — ARIPIPRAZOLE 15 MG PO TABS: 15.0000 mg | ORAL_TABLET | Freq: Two times a day (BID) | ORAL | 3 refills | Status: DC

## 2018-06-09 NOTE — Patient Instructions (Signed)
 Preventive Care 18-39 Years, Female Preventive care refers to lifestyle choices and visits with your health care provider that can promote health and wellness. What does preventive care include?   A yearly physical exam. This is also called an annual well check.  Dental exams once or twice a year.  Routine eye exams. Ask your health care provider how often you should have your eyes checked.  Personal lifestyle choices, including: ? Daily care of your teeth and gums. ? Regular physical activity. ? Eating a healthy diet. ? Avoiding tobacco and drug use. ? Limiting alcohol use. ? Practicing safe sex. ? Taking vitamin and mineral supplements as recommended by your health care provider. What happens during an annual well check? The services and screenings done by your health care provider during your annual well check will depend on your age, overall health, lifestyle risk factors, and family history of disease. Counseling Your health care provider may ask you questions about your:  Alcohol use.  Tobacco use.  Drug use.  Emotional well-being.  Home and relationship well-being.  Sexual activity.  Eating habits.  Work and work environment.  Method of birth control.  Menstrual cycle.  Pregnancy history. Screening You may have the following tests or measurements:  Height, weight, and BMI.  Diabetes screening. This is done by checking your blood sugar (glucose) after you have not eaten for a while (fasting).  Blood pressure.  Lipid and cholesterol levels. These may be checked every 5 years starting at age 20.  Skin check.  Hepatitis C blood test.  Hepatitis B blood test.  Sexually transmitted disease (STD) testing.  BRCA-related cancer screening. This may be done if you have a family history of breast, ovarian, tubal, or peritoneal cancers.  Pelvic exam and Pap test. This may be done every 3 years starting at age 21. Starting at age 30, this may be done  every 5 years if you have a Pap test in combination with an HPV test. Discuss your test results, treatment options, and if necessary, the need for more tests with your health care provider. Vaccines Your health care provider may recommend certain vaccines, such as:  Influenza vaccine. This is recommended every year.  Tetanus, diphtheria, and acellular pertussis (Tdap, Td) vaccine. You may need a Td booster every 10 years.  Varicella vaccine. You may need this if you have not been vaccinated.  HPV vaccine. If you are 26 or younger, you may need three doses over 6 months.  Measles, mumps, and rubella (MMR) vaccine. You may need at least one dose of MMR. You may also need a second dose.  Pneumococcal 13-valent conjugate (PCV13) vaccine. You may need this if you have certain conditions and were not previously vaccinated.  Pneumococcal polysaccharide (PPSV23) vaccine. You may need one or two doses if you smoke cigarettes or if you have certain conditions.  Meningococcal vaccine. One dose is recommended if you are age 19-21 years and a first-year college student living in a residence hall, or if you have one of several medical conditions. You may also need additional booster doses.  Hepatitis A vaccine. You may need this if you have certain conditions or if you travel or work in places where you may be exposed to hepatitis A.  Hepatitis B vaccine. You may need this if you have certain conditions or if you travel or work in places where you may be exposed to hepatitis B.  Haemophilus influenzae type b (Hib) vaccine. You may need this if   you have certain risk factors. Talk to your health care provider about which screenings and vaccines you need and how often you need them. This information is not intended to replace advice given to you by your health care provider. Make sure you discuss any questions you have with your health care provider. Document Released: 05/28/2001 Document Revised:  11/12/2016 Document Reviewed: 01/31/2015 Elsevier Interactive Patient Education  2019 Elsevier Inc.  
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
## 2018-06-10 LAB — COMPREHENSIVE METABOLIC PANEL
ALBUMIN: 4.4 g/dL (ref 3.8–4.8)
ALT: 18 IU/L (ref 0–32)
AST: 17 IU/L (ref 0–40)
Albumin/Globulin Ratio: 2 (ref 1.2–2.2)
Alkaline Phosphatase: 100 IU/L (ref 39–117)
BUN / CREAT RATIO: 14 (ref 9–23)
BUN: 14 mg/dL (ref 6–20)
Bilirubin Total: 0.4 mg/dL (ref 0.0–1.2)
CO2: 21 mmol/L (ref 20–29)
CREATININE: 0.98 mg/dL (ref 0.57–1.00)
Calcium: 9.3 mg/dL (ref 8.7–10.2)
Chloride: 104 mmol/L (ref 96–106)
GFR calc Af Amer: 88 mL/min/{1.73_m2} (ref >59)
GFR calc non Af Amer: 77 mL/min/{1.73_m2} (ref >59)
Globulin, Total: 2.2 g/dL (ref 1.5–4.5)
Glucose: 87 mg/dL (ref 65–99)
Potassium: 4.6 mmol/L (ref 3.5–5.2)
Sodium: 140 mmol/L (ref 134–144)
Total Protein: 6.6 g/dL (ref 6.0–8.5)

## 2018-06-10 LAB — THYROID PANEL WITH TSH
FREE THYROXINE INDEX: 1.8 (ref 1.2–4.9)
T3 Uptake Ratio: 23 % — ABNORMAL LOW (ref 24–39)
T4, Total: 7.9 ug/dL (ref 4.5–12.0)
TSH: 2.26 u[IU]/mL (ref 0.450–4.500)

## 2018-06-10 LAB — LIPID PANEL
Chol/HDL Ratio: 4.6 ratio — ABNORMAL HIGH (ref 0.0–4.4)
Cholesterol, Total: 167 mg/dL (ref 100–199)
HDL: 36 mg/dL — ABNORMAL LOW (ref >39)
LDL Calculated: 107 mg/dL — ABNORMAL HIGH (ref 0–99)
Triglycerides: 119 mg/dL (ref 0–149)
VLDL Cholesterol Cal: 24 mg/dL (ref 5–40)

## 2018-06-10 LAB — CBC
HEMOGLOBIN: 13.9 g/dL (ref 11.1–15.9)
Hematocrit: 40.9 % (ref 34.0–46.6)
MCH: 28.6 pg (ref 26.6–33.0)
MCHC: 34 g/dL (ref 31.5–35.7)
MCV: 84 fL (ref 79–97)
Platelets: 358 10*3/uL (ref 150–450)
RBC: 4.86 x10E6/uL (ref 3.77–5.28)
RDW: 13 % (ref 11.7–15.4)
WBC: 7.8 10*3/uL (ref 3.4–10.8)

## 2018-06-10 LAB — HEMOGLOBIN A1C
Est. average glucose Bld gHb Est-mCnc: 103 mg/dL
Hgb A1c MFr Bld: 5.2 % (ref 4.8–5.6)

## 2018-06-11 LAB — CYTOLOGY - PAP
Diagnosis: NEGATIVE
HPV: NOT DETECTED

## 2018-06-18 ENCOUNTER — Other Ambulatory Visit: Payer: Self-pay | Admitting: *Deleted

## 2018-06-18 MED ORDER — ACYCLOVIR 400 MG PO TABS: 400.0000 mg | ORAL_TABLET | Freq: Two times a day (BID) | ORAL | 4 refills | Status: AC | PRN

## 2018-07-02 ENCOUNTER — Other Ambulatory Visit: Payer: Self-pay | Admitting: Obstetrics and Gynecology

## 2018-07-02 MED ORDER — LISDEXAMFETAMINE DIMESYLATE 70 MG PO CAPS: 70.0000 mg | ORAL_CAPSULE | ORAL | 0 refills | Status: DC

## 2018-07-06 ENCOUNTER — Other Ambulatory Visit: Payer: Self-pay | Admitting: *Deleted

## 2018-07-06 MED ORDER — HYDROXYZINE HCL 50 MG PO TABS: 100.0000 mg | ORAL_TABLET | Freq: Every day | ORAL | 4 refills | Status: DC

## 2018-08-06 ENCOUNTER — Other Ambulatory Visit: Payer: Self-pay | Admitting: Obstetrics and Gynecology

## 2018-08-19 ENCOUNTER — Telehealth: Payer: Self-pay

## 2018-08-19 NOTE — Telephone Encounter (Signed)
Coronavirus (COVID-19) Are you at risk?  Are you at risk for the Coronavirus (COVID-19)?  To be considered HIGH RISK for Coronavirus (COVID-19), you have to meet the following criteria:  . Traveled to [place], [place], [place], [place] or [place]; or in the [place] to [place], [place], [place], or [place]; and have fever, cough, and shortness of breath within the last 2 weeks of travel OR . Been in close contact with a person diagnosed with COVID-19 within the last 2 weeks and have fever, cough, and shortness of breath . IF YOU DO NOT MEET THESE CRITERIA, YOU ARE CONSIDERED LOW RISK FOR COVID-19.  What to do if you are HIGH RISK for COVID-19?  . If you are having a medical emergency, call 911. . Seek medical care right away. Before you go to a doctor's office, urgent care or emergency department, call ahead and tell them about your recent travel, contact with someone diagnosed with COVID-19, and your symptoms. You should receive instructions from your physician's office regarding next steps of care.  . When you arrive at healthcare provider, tell the healthcare staff immediately you have returned from visiting [place], [place], [place], [place] or [place]; or traveled in the [place] to [place], [place], [place], or [place]; in the last two weeks or you have been in close contact with a person diagnosed with COVID-19 in the last 2 weeks.   . Tell the health care staff about your symptoms: fever, cough and shortness of breath. . After you have been seen by a medical provider, you will be either: o Tested for (COVID-19) and discharged home on quarantine except to seek medical care if symptoms worsen, and asked to  - Stay home and avoid contact with others until you get your results (4-5 days)  - Avoid travel on public transportation if possible (such as bus, train, or airplane) or o Sent to the Emergency Department by EMS for evaluation, COVID-19 testing, and possible  admission depending on your condition and test results.  What to do if you are LOW RISK for COVID-19?  Reduce your risk of any infection by using the same precautions used for avoiding the common cold or flu:  . Wash your hands often with soap and warm water for at least 20 seconds.  If soap and water are not readily available, use an alcohol-based hand sanitizer with at least 60% alcohol.  . If coughing or sneezing, cover your mouth and nose by coughing or sneezing into the elbow areas of your shirt or coat, into a tissue or into your sleeve (not your hands). . Avoid shaking hands with others and consider head nods or verbal greetings only. . Avoid touching your eyes, nose, or mouth with unwashed hands.  . Avoid close contact with people who are sick. . Avoid places or events with large numbers of people in one location, like concerts or sporting events. . Carefully consider travel plans you have or are making. . If you are planning any travel outside or inside the [place], visit the CDC's Travelers' Health webpage for the latest health notices. . If you have some symptoms but not all symptoms, continue to monitor at home and seek medical attention if your symptoms worsen. . If you are having a medical emergency, call 911.   ADDITIONAL HEALTHCARE OPTIONS FOR PATIENTS  [facility] / e-Visit: [email or web address]         [facility]: [phone]  [facility]: [phone]                   [facility]: [phone]   Prescreened. Neg .cm 

## 2018-08-20 ENCOUNTER — Other Ambulatory Visit: Payer: Self-pay

## 2018-08-20 ENCOUNTER — Ambulatory Visit: Payer: 59 | Admitting: Obstetrics and Gynecology

## 2018-08-20 ENCOUNTER — Encounter: Payer: Self-pay | Admitting: Obstetrics and Gynecology

## 2018-08-20 VITALS — BP 116/75 | HR 94 | Ht 65.0 in | Wt 260.6 lb

## 2018-08-20 DIAGNOSIS — R102 Pelvic and perineal pain: Secondary | ICD-10-CM

## 2018-08-20 DIAGNOSIS — N809 Endometriosis, unspecified: Secondary | ICD-10-CM | POA: Diagnosis not present

## 2018-08-20 DIAGNOSIS — N9419 Other specified dyspareunia: Secondary | ICD-10-CM

## 2018-08-20 DIAGNOSIS — R309 Painful micturition, unspecified: Secondary | ICD-10-CM

## 2018-08-20 MED ORDER — SERTRALINE HCL 100 MG PO TABS: 100.0000 mg | ORAL_TABLET | Freq: Every day | ORAL | 6 refills | Status: DC

## 2018-08-20 NOTE — Progress Notes (Signed)
Pt is present today for a 6 month check for pelvic pain. Pt stated that she is still having a lot of pain.

## 2018-08-20 NOTE — Progress Notes (Signed)
    GYNECOLOGY PROGRESS NOTE  Subjective:    Patient ID: Sarah Lawrence, female    DOB: July 05, 1985, 33 y.o.   MRN: [id number]  HPI  Patient is a 33 y.o. G73P0 female who presents for 6 month f/u endometriosis, pelvic pain, and bladder pain.  She is s/p hysterectomy with right oophorectomy.  She is currently taking Freida Busman, has been on medication x 6 months.  She was previously seen by Dr. Malachi Paradise.  Patient notes that she is still having intermittent pain however is much less than before.  Also noting continued bladder pain after voiding. Was seen by Urology with negative findings. Also still experiencing some dyspareunia.     The following portions of the patient's history were reviewed and updated as appropriate: allergies, current medications, past family history, past medical history, past social history, past surgical history and problem list.   Review of Systems Pertinent items noted in HPI and remainder of comprehensive ROS otherwise negative.   Objective:   Blood pressure 116/75, pulse 94, height 5\' 5"  (1.651 m), weight 260 lb 9.6 oz (118.2 kg), last menstrual period 07/18/2017. Body mass index is 43.37 kg/m.  General appearance: alert, no distress and morbidly obese Abdomen: soft, non-tender; bowel sounds normal; no masses,  no organomegaly Pelvic: deferred.   Assessment:   Endometriosis Medication management Chronic pelvic pain Bladder pain  Plan:   1. Endometriosis currently on Orilissa 150 mg.  Patient still with some residual symptoms of pain and dyspareunia. Discussed option of increasing to 200 mg to better management symptoms.  Patient states that she is ok to remain on current dosing for now.   2. Discussed alternative options for managing bladder pain, including use of pyridium, Vistaril, Elavil, or Nuerontin.  Patient desires to think over options.  3. To f/u again in 6 months   Rubie Maid, MD [facility]

## 2018-08-23 ENCOUNTER — Encounter: Payer: Self-pay | Admitting: Obstetrics and Gynecology

## 2018-08-25 ENCOUNTER — Ambulatory Visit
Admission: EM | Admit: 2018-08-25 | Discharge: 2018-08-25 | Disposition: A | Discharge status: Home / Self Care | Payer: 59 | Attending: Family Medicine | Admitting: Family Medicine

## 2018-08-25 ENCOUNTER — Encounter: Payer: Self-pay | Admitting: Emergency Medicine

## 2018-08-25 ENCOUNTER — Other Ambulatory Visit: Payer: Self-pay

## 2018-08-25 DIAGNOSIS — B9689 Other specified bacterial agents as the cause of diseases classified elsewhere: Secondary | ICD-10-CM | POA: Diagnosis not present

## 2018-08-25 DIAGNOSIS — N3001 Acute cystitis with hematuria: Secondary | ICD-10-CM | POA: Diagnosis not present

## 2018-08-25 LAB — URINALYSIS, COMPLETE (UACMP) WITH MICROSCOPIC: WBC, UA: >50 WBC/hpf (ref 0–5)

## 2018-08-25 MED ORDER — CEPHALEXIN 500 MG PO CAPS: 500.0000 mg | ORAL_CAPSULE | Freq: Two times a day (BID) | ORAL | 0 refills | Status: DC

## 2018-08-25 NOTE — Discharge Instructions (Signed)
Medication as prescribed.  Take care  Dr. Ciria Bernardini  

## 2018-08-25 NOTE — ED Triage Notes (Signed)
Patient c/o burning when urinating, lower abdominal pain and back pain that started yesterday.  Patient reports some blood in her urine.  Patient denies fever.  Patient reports nausea.  Patient denies V/D.
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
## 2018-08-27 ENCOUNTER — Encounter: Payer: Self-pay | Admitting: Obstetrics and Gynecology

## 2018-08-27 LAB — URINE CULTURE: Culture: >=100000 — AB

## 2018-08-28 ENCOUNTER — Telehealth (HOSPITAL_COMMUNITY): Payer: Self-pay | Admitting: Emergency Medicine

## 2018-08-28 NOTE — Telephone Encounter (Signed)
Urine culture was positive for e coli and was given keflex  at urgent care visit. Attempted to reach patient. No answer at this time.   

## 2018-09-04 ENCOUNTER — Other Ambulatory Visit: Payer: Self-pay

## 2018-09-04 ENCOUNTER — Ambulatory Visit
Admission: EM | Admit: 2018-09-04 | Discharge: 2018-09-04 | Disposition: A | Discharge status: Home / Self Care | Payer: 59 | Attending: Family Medicine | Admitting: Family Medicine

## 2018-09-04 DIAGNOSIS — N390 Urinary tract infection, site not specified: Secondary | ICD-10-CM

## 2018-09-04 DIAGNOSIS — B9689 Other specified bacterial agents as the cause of diseases classified elsewhere: Secondary | ICD-10-CM

## 2018-09-04 LAB — URINALYSIS, COMPLETE (UACMP) WITH MICROSCOPIC
Glucose, UA: NEGATIVE mg/dL
Nitrite: NEGATIVE
Protein, ur: 100 mg/dL — AB
RBC / HPF: >50 RBC/hpf (ref 0–5)
Specific Gravity, Urine: 1.025 (ref 1.005–1.030)
WBC, UA: >50 WBC/hpf (ref 0–5)
pH: 6 (ref 5.0–8.0)

## 2018-09-04 MED ORDER — HYDROCODONE-ACETAMINOPHEN 5-325 MG PO TABS: ORAL_TABLET | ORAL | 0 refills | Status: DC

## 2018-09-04 MED ORDER — CEFTRIAXONE SODIUM 1 G IJ SOLR
1.0000 g | Freq: Once | INTRAMUSCULAR | Status: AC
  Administered 2018-09-04: 1 g via INTRAMUSCULAR

## 2018-09-04 MED ORDER — SULFAMETHOXAZOLE-TRIMETHOPRIM 800-160 MG PO TABS: 1.0000 | ORAL_TABLET | Freq: Two times a day (BID) | ORAL | 0 refills | Status: AC
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
## 2018-09-04 NOTE — ED Triage Notes (Signed)
Patient complains of hematuria. Patient states that she was 10 days ago and given Rx for Keflex. Patient states that she only had a trace last time but is now noticing gross hematuria. Patient states that she had shown improvement from Keflex and had finished the antibiotic Monday but hematuria returned today. Patient also reports back pain.

## 2018-09-08 DIAGNOSIS — H5213 Myopia, bilateral: Secondary | ICD-10-CM | POA: Diagnosis not present

## 2018-09-10 ENCOUNTER — Encounter: Payer: Self-pay | Admitting: Urology

## 2018-09-16 ENCOUNTER — Other Ambulatory Visit: Payer: Self-pay | Admitting: Obstetrics and Gynecology

## 2018-09-16 MED ORDER — TRIAZOLAM 0.25 MG PO TABS: 0.2500 mg | ORAL_TABLET | Freq: Every evening | ORAL | 0 refills | Status: DC | PRN

## 2018-09-18 ENCOUNTER — Telehealth: Payer: Self-pay | Admitting: Obstetrics and Gynecology

## 2018-09-18 NOTE — Telephone Encounter (Signed)
Spoke with pt

## 2018-09-18 NOTE — Telephone Encounter (Signed)
The patient called and stated that she needs to speak with her nurse or provider to ask if it is okay to take two of her current medications together. Please advise.

## 2018-09-21 ENCOUNTER — Other Ambulatory Visit: Payer: Self-pay

## 2018-09-21 ENCOUNTER — Ambulatory Visit: Payer: 59 | Admitting: Family Medicine

## 2018-09-26 ENCOUNTER — Other Ambulatory Visit: Payer: Self-pay

## 2018-09-26 ENCOUNTER — Ambulatory Visit
Admission: EM | Admit: 2018-09-26 | Discharge: 2018-09-26 | Disposition: A | Discharge status: Home / Self Care | Payer: 59 | Attending: Family Medicine | Admitting: Family Medicine

## 2018-09-26 ENCOUNTER — Encounter: Payer: Self-pay | Admitting: Emergency Medicine

## 2018-09-26 DIAGNOSIS — R319 Hematuria, unspecified: Secondary | ICD-10-CM

## 2018-09-26 DIAGNOSIS — N390 Urinary tract infection, site not specified: Secondary | ICD-10-CM | POA: Diagnosis not present

## 2018-09-26 LAB — URINALYSIS, COMPLETE (UACMP) WITH MICROSCOPIC
Bilirubin Urine: NEGATIVE
Glucose, UA: NEGATIVE mg/dL
Ketones, ur: NEGATIVE mg/dL
Nitrite: NEGATIVE
Protein, ur: NEGATIVE mg/dL
Specific Gravity, Urine: <1.005 — ABNORMAL LOW (ref 1.005–1.030)
WBC, UA: >50 WBC/hpf (ref 0–5)
pH: 6 (ref 5.0–8.0)

## 2018-09-26 MED ORDER — NITROFURANTOIN MONOHYD MACRO 100 MG PO CAPS: 100.0000 mg | ORAL_CAPSULE | Freq: Two times a day (BID) | ORAL | 0 refills | Status: AC

## 2018-09-26 MED ORDER — PHENAZOPYRIDINE HCL 200 MG PO TABS: 200.0000 mg | ORAL_TABLET | Freq: Three times a day (TID) | ORAL | 0 refills | Status: DC
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
## 2018-09-26 NOTE — Discharge Instructions (Addendum)
Take medication as prescribed. Rest. Drink plenty of fluids. Monitor trigger.   Follow up with urology as planned.   Follow up with your primary care physician this week as needed. Return to Urgent care for new or worsening concerns.

## 2018-09-26 NOTE — ED Triage Notes (Signed)
Patient c/o burning when urinating and increase in urinary urgency that started this morning.  Patient states that her last UTI was about 3-4 weeks ago.  Patient states that she has an appointment with Urology on June 18.  Patient denies fevers.

## 2018-09-29 ENCOUNTER — Telehealth: Payer: 59 | Admitting: Urology

## 2018-09-29 LAB — URINE CULTURE: Culture: 80000 — AB

## 2018-09-30 ENCOUNTER — Telehealth (HOSPITAL_COMMUNITY): Payer: Self-pay | Admitting: Emergency Medicine

## 2018-09-30 NOTE — Telephone Encounter (Signed)
Urine culture was positive for e coli and was given  macrobid at urgent care visit. Attempted to reach patient. No answer at this time.   

## 2018-10-01 ENCOUNTER — Telehealth (INDEPENDENT_AMBULATORY_CARE_PROVIDER_SITE_OTHER): Payer: 59 | Admitting: Urology

## 2018-10-01 ENCOUNTER — Other Ambulatory Visit: Payer: Self-pay

## 2018-10-01 DIAGNOSIS — N390 Urinary tract infection, site not specified: Secondary | ICD-10-CM

## 2018-10-01 DIAGNOSIS — R3989 Other symptoms and signs involving the genitourinary system: Secondary | ICD-10-CM | POA: Diagnosis not present

## 2018-10-01 MED ORDER — TRIMETHOPRIM 100 MG PO TABS: 100.0000 mg | ORAL_TABLET | Freq: Every day | ORAL | 0 refills | Status: DC

## 2018-10-01 NOTE — Progress Notes (Signed)
Virtual Visit via Video Note  I connected with Sarah Lawrence on 10/01/18 at 10:00 AM EDT by a video enabled telemedicine application and verified that I am speaking with the correct person using two identifiers.  Location: Patient: work Provider: office   I discussed the limitations of evaluation and management by telemedicine and the availability of in person appointments. The patient expressed understanding and agreed to proceed.  History of Present Illness: 33 year old female with a history of bladder pain now presents for further evaluation of recurrent urinary tract infections.  Over the past month, she has been seen at [facility] urgent care x3 with severe urgency frequency dysuria and gross hematuria.  Each time, urine was grossly positive.  Urine culture was sent on 2 occasions she ultimately grew pansensitive E. coli.  She was seen just last week and is currently on Macrobid.  Her symptoms are improving.  Chart review was performed today.  Prior to this, she had rare infrequent urinary tract infections.  She believes her symptoms may be exacerbated by sexual activity.  She does have a personal history of bladder pain, primarily with rapid bladder emptying.  This is a chronic issue.  She is managed by GYN for endometriosis currently on [place].  She has had work-up including cystoscopy earlier this year was unremarkable.  Previous upper tract imaging from 2017 unremarkable with normal kidneys, no stones.   Observations/Objective: Pleasant, interactive, appears well   Assessment and Plan:  1. Bladder pain Essentially stable Desires to intervention/ treatment at this time  2. 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  She is agreeable to this plan.   Follow Up Instructions:  As needed   I discussed the assessment and treatment plan with the patient. The patient was provided an opportunity to ask questions and all were answered. The patient agreed with the plan and demonstrated an understanding of the instructions.   The patient was advised to call back or seek an in-person evaluation if the symptoms worsen or if the condition fails to improve as anticipated.  I provided 12 minutes of non-face-to-face time during this encounter.   Hollice Espy, MD

## 2018-10-06 ENCOUNTER — Other Ambulatory Visit: Payer: Self-pay | Admitting: *Deleted

## 2018-10-06 MED ORDER — ARIPIPRAZOLE 15 MG PO TABS: 15.0000 mg | ORAL_TABLET | Freq: Two times a day (BID) | ORAL | 3 refills | Status: DC

## 2018-10-09 ENCOUNTER — Other Ambulatory Visit: Payer: Self-pay

## 2018-10-09 ENCOUNTER — Ambulatory Visit: Payer: 59 | Admitting: Obstetrics and Gynecology

## 2018-10-09 ENCOUNTER — Encounter: Payer: Self-pay | Admitting: Obstetrics and Gynecology

## 2018-10-09 VITALS — BP 115/73 | HR 86 | Ht 65.0 in | Wt 258.8 lb

## 2018-10-09 DIAGNOSIS — R635 Abnormal weight gain: Secondary | ICD-10-CM | POA: Diagnosis not present

## 2018-10-09 DIAGNOSIS — T50905A Adverse effect of unspecified drugs, medicaments and biological substances, initial encounter: Secondary | ICD-10-CM

## 2018-10-09 DIAGNOSIS — G479 Sleep disorder, unspecified: Secondary | ICD-10-CM | POA: Diagnosis not present

## 2018-10-09 DIAGNOSIS — F419 Anxiety disorder, unspecified: Secondary | ICD-10-CM | POA: Diagnosis not present

## 2018-10-09 DIAGNOSIS — F331 Major depressive disorder, recurrent, moderate: Secondary | ICD-10-CM | POA: Diagnosis not present

## 2018-10-09 MED ORDER — LISDEXAMFETAMINE DIMESYLATE 70 MG PO CAPS: 70.0000 mg | ORAL_CAPSULE | ORAL | 0 refills | Status: DC

## 2018-10-09 MED ORDER — TRIAZOLAM 0.25 MG PO TABS: 0.2500 mg | ORAL_TABLET | Freq: Every evening | ORAL | 2 refills | Status: DC | PRN

## 2018-10-09 MED ORDER — BUPROPION HCL ER (XL) 150 MG PO TB24: 300.0000 mg | ORAL_TABLET | Freq: Every day | ORAL | 2 refills | Status: DC

## 2018-10-09 MED ORDER — SERTRALINE HCL 50 MG PO TABS: 50.0000 mg | ORAL_TABLET | Freq: Every day | ORAL | 1 refills | Status: DC

## 2018-10-09 NOTE — Progress Notes (Signed)
Subjective:     Patient ID: Sarah Lawrence, female   DOB: 1985/11/10, 33 y.o.   MRN: [id number]  HPI  Here to discuss medications and weight gain, anxiety, and feeling down. Doesn't like that zoloft is causing this and has to go so high on the dose to get relief from symptoms. Sleep med is helping a lot.    Had 4 UTI in last 3 months and is seeing urology for it.   Needs refill for vyvanse and sleep medications.   Depression screen [facility] 2/9 10/09/2018 06/09/2018  Decreased Interest 1 2  Down, Depressed, Hopeless 1 2  PHQ - 2 Score 2 4  Altered sleeping 3 3  Tired, decreased energy 2 3  Change in appetite 3 3  Feeling bad or failure about yourself  1 1  Trouble concentrating 2 3  Moving slowly or fidgety/restless 1 2  Suicidal thoughts 0 0  PHQ-9 Score 14 19  Difficult doing work/chores Very difficult Somewhat difficult   GAD 7 : Generalized Anxiety Score 10/09/2018  Nervous, Anxious, on Edge 3  Control/stop worrying 3  Worry too much - different things 3  Trouble relaxing 3  Restless 2  Easily annoyed or irritable 3  Afraid - awful might happen 3  Total GAD 7 Score 20  Anxiety Difficulty Somewhat difficult    Review of Systems  Constitutional: Positive for unexpected weight change.  Psychiatric/Behavioral: Positive for dysphoric mood and sleep disturbance. The patient is nervous/anxious.   All other systems reviewed and are negative.      Objective:   Physical Exam A&Ox4 Well groomed female in no distress Blood pressure 115/73, pulse 86, height 5\' 5"  (1.651 m), weight 258 lb 12.8 oz (117.4 kg), last menstrual period 07/18/2017. Body mass index is 43.07 kg/m.  Psychiatric Specialty Exam: Physical Exam  Review of Systems  Constitutional: Positive for unexpected weight change.  Psychiatric/Behavioral: Positive for dysphoric mood and sleep disturbance. The patient is nervous/anxious.   All other systems reviewed and are negative.   Blood pressure 115/73, pulse 86,  height 5\' 5"  (1.651 m), weight 258 lb 12.8 oz (117.4 kg), last menstrual period 07/18/2017.Body mass index is 43.07 kg/m.  General Appearance: Fairly Groomed  Eye Contact:  Good  Speech:  Clear and Coherent  Volume:  Normal  Mood:  Depressed and Euphoric  Affect:  Appropriate  Thought Process:  Coherent  Orientation:  Full (Time, Place, and Person)  Thought Content:  WDL  Suicidal Thoughts:  No  Homicidal Thoughts:  No  Memory:  Immediate;   Good Recent;   Good Remote;   Fair  Judgement:  Intact  Insight:  Fair  Psychomotor Activity:  Normal  Concentration:  Concentration: Good and Attention Span: Fair  Recall:  [employer] of Knowledge:  Good  Language:  Good  Akathisia:  Negative  Handed:  Right  AIMS (if indicated):     Assets:  Communication Skills Desire for Improvement Financial Resources/Insurance Housing Intimacy Resilience Transportation Vocational/Educational  ADL's:  Intact  Cognition:  WNL  Sleep:         Assessment:     Depression Weight gain ADD     Plan:     Desires trial of wellbutrin- will add 150mg  daily for 2 weeks, then increase to 300mg  daily. At the same time will taper off zoloft and go down to 100mg  daily for a week, then 50mg  daily for 2 weeks then stop.  referred to [facility] for counseling. Refilled meds  otherwise.  RTC in 4-6 weeks or message via MyChart to update me on how she is feeling.  Maretta Overdorf,CNM

## 2018-11-18 ENCOUNTER — Encounter: Payer: 59 | Admitting: Obstetrics and Gynecology

## 2018-12-10 ENCOUNTER — Encounter: Payer: 59 | Admitting: Obstetrics and Gynecology

## 2018-12-11 ENCOUNTER — Other Ambulatory Visit: Payer: Self-pay

## 2018-12-11 DIAGNOSIS — Z20822 Contact with and (suspected) exposure to covid-19: Secondary | ICD-10-CM

## 2018-12-13 LAB — NOVEL CORONAVIRUS, NAA: SARS-CoV-2, NAA: DETECTED — AB

## 2018-12-20 ENCOUNTER — Emergency Department: Payer: BC Managed Care – PPO

## 2018-12-20 ENCOUNTER — Other Ambulatory Visit: Payer: Self-pay

## 2018-12-20 ENCOUNTER — Encounter: Payer: Self-pay | Admitting: Emergency Medicine

## 2018-12-20 ENCOUNTER — Emergency Department
Admission: EM | Admit: 2018-12-20 | Discharge: 2018-12-20 | Disposition: A | Discharge status: Home / Self Care | Payer: BC Managed Care – PPO | Attending: Student in an Organized Health Care Education/Training Program | Admitting: Student in an Organized Health Care Education/Training Program

## 2018-12-20 DIAGNOSIS — Z79899 Other long term (current) drug therapy: Secondary | ICD-10-CM | POA: Diagnosis not present

## 2018-12-20 DIAGNOSIS — U071 COVID-19: Secondary | ICD-10-CM | POA: Diagnosis not present

## 2018-12-20 DIAGNOSIS — J189 Pneumonia, unspecified organism: Secondary | ICD-10-CM | POA: Diagnosis not present

## 2018-12-20 DIAGNOSIS — R0602 Shortness of breath: Secondary | ICD-10-CM | POA: Diagnosis present

## 2018-12-20 LAB — CBC WITH DIFFERENTIAL/PLATELET
Abs Immature Granulocytes: 0.01 10*3/uL (ref 0.00–0.07)
Basophils Absolute: 0 10*3/uL (ref 0.0–0.1)
Basophils Relative: 0 %
Eosinophils Absolute: 0 10*3/uL (ref 0.0–0.5)
Eosinophils Relative: 0 %
HCT: 43.1 % (ref 36.0–46.0)
Hemoglobin: 14.7 g/dL (ref 12.0–15.0)
Immature Granulocytes: 0 %
Lymphocytes Relative: 21 %
Lymphs Abs: 1.2 10*3/uL (ref 0.7–4.0)
MCH: 27.9 pg (ref 26.0–34.0)
MCHC: 34.1 g/dL (ref 30.0–36.0)
MCV: 81.9 fL (ref 80.0–100.0)
Monocytes Absolute: 0.3 10*3/uL (ref 0.1–1.0)
Monocytes Relative: 6 %
Neutro Abs: 4.2 10*3/uL (ref 1.7–7.7)
Neutrophils Relative %: 73 %
Platelets: 242 10*3/uL (ref 150–400)
RBC: 5.26 MIL/uL — ABNORMAL HIGH (ref 3.87–5.11)
RDW: 13 % (ref 11.5–15.5)
WBC: 5.7 10*3/uL (ref 4.0–10.5)
nRBC: 0 % (ref 0.0–0.2)

## 2018-12-20 LAB — COMPREHENSIVE METABOLIC PANEL
ALT: 23 U/L (ref 0–44)
AST: 30 U/L (ref 15–41)
Albumin: 4.1 g/dL (ref 3.5–5.0)
Alkaline Phosphatase: 106 U/L (ref 38–126)
Anion gap: 8 (ref 5–15)
BUN: 13 mg/dL (ref 6–20)
CO2: 25 mmol/L (ref 22–32)
Calcium: 8.7 mg/dL — ABNORMAL LOW (ref 8.9–10.3)
Chloride: 107 mmol/L (ref 98–111)
Creatinine, Ser: 1.1 mg/dL — ABNORMAL HIGH (ref 0.44–1.00)
GFR calc Af Amer: >60 mL/min (ref >60)
GFR calc non Af Amer: >60 mL/min (ref >60)
Glucose, Bld: 113 mg/dL — ABNORMAL HIGH (ref 70–99)
Potassium: 3.7 mmol/L (ref 3.5–5.1)
Sodium: 140 mmol/L (ref 135–145)
Total Bilirubin: 0.4 mg/dL (ref 0.3–1.2)
Total Protein: 7.4 g/dL (ref 6.5–8.1)

## 2018-12-20 MED ORDER — ONDANSETRON HCL 4 MG PO TABS: 4.0000 mg | ORAL_TABLET | Freq: Every day | ORAL | 0 refills | Status: DC | PRN

## 2018-12-20 MED ORDER — SODIUM CHLORIDE 0.9 % IV BOLUS
500.0000 mL | Freq: Once | INTRAVENOUS | Status: AC
  Administered 2018-12-20: 500 mL via INTRAVENOUS

## 2018-12-20 MED ORDER — IPRATROPIUM-ALBUTEROL 0.5-2.5 (3) MG/3ML IN SOLN
3.0000 mL | Freq: Once | RESPIRATORY_TRACT | Status: DC
  Filled 2018-12-20: qty 3

## 2018-12-20 MED ORDER — AZITHROMYCIN 500 MG PO TABS: 500.0000 mg | ORAL_TABLET | Freq: Every day | ORAL | 0 refills | Status: AC

## 2018-12-20 MED ORDER — DEXAMETHASONE SODIUM PHOSPHATE 10 MG/ML IJ SOLN
10.0000 mg | Freq: Once | INTRAMUSCULAR | Status: AC
  Administered 2018-12-20: 17:00:00 10 mg via INTRAVENOUS
  Filled 2018-12-20: qty 1

## 2018-12-20 MED ORDER — AZITHROMYCIN 500 MG PO TABS
500.0000 mg | ORAL_TABLET | Freq: Once | ORAL | Status: AC
  Administered 2018-12-20: 500 mg via ORAL
  Filled 2018-12-20: qty 1

## 2018-12-20 MED ORDER — ONDANSETRON HCL 4 MG/2ML IJ SOLN
4.0000 mg | Freq: Once | INTRAMUSCULAR | Status: AC
  Administered 2018-12-20: 4 mg via INTRAVENOUS
  Filled 2018-12-20: qty 2

## 2018-12-20 MED ORDER — ACETAMINOPHEN 500 MG PO TABS
1000.0000 mg | ORAL_TABLET | Freq: Once | ORAL | Status: AC
  Administered 2018-12-20: 16:00:00 1000 mg via ORAL
  Filled 2018-12-20: qty 2

## 2018-12-20 MED ORDER — ALBUTEROL SULFATE HFA 108 (90 BASE) MCG/ACT IN AERS
2.0000 | INHALATION_SPRAY | Freq: Once | RESPIRATORY_TRACT | Status: AC
  Administered 2018-12-20: 2 via RESPIRATORY_TRACT
  Filled 2018-12-20: qty 6.7

## 2018-12-20 NOTE — ED Notes (Signed)
Pt given water to drink at this time. 

## 2018-12-20 NOTE — ED Triage Notes (Signed)
Pt to ER states she tested positive for CoVid on 8/28 and is having increasing SHOB, chest tightness and thinks she is dehydrated.  Pt with mild cough in triage, able to talk in complete sentences.

## 2018-12-20 NOTE — ED Notes (Signed)
E-signature not working at this time. Pt verbalizes understanding of D/C instructions, prescriptions and follow up care. No further questions at this time. Pt in NAD at time of D/C. Pt ambulatory at departure.
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
## 2018-12-26 ENCOUNTER — Other Ambulatory Visit: Payer: Self-pay | Admitting: Obstetrics and Gynecology

## 2018-12-29 ENCOUNTER — Encounter: Payer: 59 | Admitting: Obstetrics and Gynecology

## 2018-12-29 ENCOUNTER — Other Ambulatory Visit: Payer: Self-pay | Admitting: Urology

## 2018-12-31 ENCOUNTER — Other Ambulatory Visit: Payer: Self-pay | Admitting: Obstetrics and Gynecology

## 2018-12-31 MED ORDER — LISDEXAMFETAMINE DIMESYLATE 70 MG PO CAPS: 70.0000 mg | ORAL_CAPSULE | ORAL | 0 refills | Status: DC

## 2019-01-19 ENCOUNTER — Other Ambulatory Visit: Payer: Self-pay | Admitting: *Deleted

## 2019-01-19 MED ORDER — HYDROXYZINE HCL 50 MG PO TABS: 100.0000 mg | ORAL_TABLET | Freq: Every day | ORAL | 4 refills | Status: AC

## 2019-01-28 ENCOUNTER — Encounter: Payer: Self-pay | Admitting: Urology

## 2019-01-28 ENCOUNTER — Encounter: Payer: Self-pay | Admitting: Emergency Medicine

## 2019-01-28 ENCOUNTER — Ambulatory Visit
Admission: EM | Admit: 2019-01-28 | Discharge: 2019-01-28 | Disposition: A | Discharge status: Home / Self Care | Payer: BC Managed Care – PPO | Attending: Family Medicine | Admitting: Family Medicine

## 2019-01-28 ENCOUNTER — Other Ambulatory Visit: Payer: Self-pay

## 2019-01-28 DIAGNOSIS — N390 Urinary tract infection, site not specified: Secondary | ICD-10-CM

## 2019-01-28 LAB — URINALYSIS, COMPLETE (UACMP) WITH MICROSCOPIC
Bacteria, UA: NONE SEEN
Bilirubin Urine: NEGATIVE
Glucose, UA: NEGATIVE mg/dL
Ketones, ur: NEGATIVE mg/dL
Nitrite: NEGATIVE
Protein, ur: NEGATIVE mg/dL
Specific Gravity, Urine: 1.01 (ref 1.005–1.030)
pH: 6.5 (ref 5.0–8.0)

## 2019-01-28 MED ORDER — TRIMETHOPRIM 100 MG PO TABS: 100.0000 mg | ORAL_TABLET | Freq: Every day | ORAL | 1 refills | Status: DC

## 2019-01-28 MED ORDER — CEPHALEXIN 500 MG PO CAPS: 500.0000 mg | ORAL_CAPSULE | Freq: Two times a day (BID) | ORAL | 0 refills | Status: DC

## 2019-01-28 NOTE — ED Triage Notes (Signed)
Patient c/o urinary urgency frequency, dysuria that started last night.
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
## 2019-01-30 LAB — URINE CULTURE: Culture: 80000 — AB

## 2019-02-01 ENCOUNTER — Telehealth (HOSPITAL_COMMUNITY): Payer: Self-pay | Admitting: Emergency Medicine

## 2019-02-01 NOTE — Telephone Encounter (Signed)
Urine culture was positive for KLEBSIELLA PNEUMONIAE and was given  keflex at urgent care visit. Attempted to reach patient to see how she was feeling.. No answer at this time.

## 2019-02-02 ENCOUNTER — Telehealth: Payer: Self-pay

## 2019-02-02 ENCOUNTER — Other Ambulatory Visit: Payer: Self-pay

## 2019-02-02 MED ORDER — TRIAZOLAM 0.25 MG PO TABS: 0.2500 mg | ORAL_TABLET | Freq: Every evening | ORAL | 0 refills | Status: DC | PRN

## 2019-02-02 NOTE — Telephone Encounter (Signed)
mychart message sent to patient- script is ready for pickup.

## 2019-02-17 ENCOUNTER — Other Ambulatory Visit: Payer: Self-pay

## 2019-02-17 ENCOUNTER — Ambulatory Visit: Payer: BC Managed Care – PPO | Admitting: Obstetrics and Gynecology

## 2019-02-17 ENCOUNTER — Encounter: Payer: Self-pay | Admitting: Obstetrics and Gynecology

## 2019-02-17 VITALS — BP 103/64 | HR 68 | Ht 65.0 in | Wt 253.4 lb

## 2019-02-17 DIAGNOSIS — Z713 Dietary counseling and surveillance: Secondary | ICD-10-CM | POA: Diagnosis not present

## 2019-02-17 DIAGNOSIS — F419 Anxiety disorder, unspecified: Secondary | ICD-10-CM

## 2019-02-17 DIAGNOSIS — G479 Sleep disorder, unspecified: Secondary | ICD-10-CM

## 2019-02-17 DIAGNOSIS — Z6841 Body Mass Index (BMI) 40.0 and over, adult: Secondary | ICD-10-CM

## 2019-02-17 DIAGNOSIS — Z7689 Persons encountering health services in other specified circumstances: Secondary | ICD-10-CM

## 2019-02-17 MED ORDER — CYANOCOBALAMIN 1000 MCG/ML IJ SOLN: 1000.0000 ug | INTRAMUSCULAR | 1 refills | Status: DC

## 2019-02-17 MED ORDER — PHENTERMINE HCL 37.5 MG PO TABS: 37.5000 mg | ORAL_TABLET | Freq: Every day | ORAL | 2 refills | Status: DC

## 2019-02-17 MED ORDER — CYANOCOBALAMIN 1000 MCG/ML IJ SOLN
1000.0000 ug | Freq: Once | INTRAMUSCULAR | Status: AC
  Administered 2019-02-17: 1000 ug via INTRAMUSCULAR

## 2019-02-17 MED ORDER — TRIAZOLAM 0.25 MG PO TABS: 0.2500 mg | ORAL_TABLET | Freq: Every evening | ORAL | 2 refills | Status: DC | PRN

## 2019-02-17 NOTE — Progress Notes (Signed)
Patient here to discuss weight management options.  Seeing PCP since August for Anxiety and Depression.

## 2019-02-17 NOTE — Progress Notes (Signed)
  Subjective:     Patient ID: Sarah Lawrence, female   DOB: 09/30/1985, 33 y.o.   MRN: [id number]  HPI Here to discuss restarting weight loss program. She was on it years ago and did well. Feels like other medications are stable at this time and like she can focus on weight loss.  She also needs a refill on sleep medication.  Has established care with PCP at [facility] primary care in [place]. Dr Arlington Calix is managing other health issues.   Depression screen [facility] 2/9 02/17/2019 10/09/2018 06/09/2018  Decreased Interest 1 1 2   Down, Depressed, Hopeless 0 1 2  PHQ - 2 Score 1 2 4   Altered sleeping 3 3 3   Tired, decreased energy 3 2 3   Change in appetite 3 3 3   Feeling bad or failure about yourself  0 1 1  Trouble concentrating 1 2 3   Moving slowly or fidgety/restless 0 1 2  Suicidal thoughts 0 0 0  PHQ-9 Score 11 14 19   Difficult doing work/chores Somewhat difficult Very difficult Somewhat difficult   GAD 7 : Generalized Anxiety Score 02/17/2019 10/09/2018  Nervous, Anxious, on Edge 2 3  Control/stop worrying 3 3  Worry too much - different things 3 3  Trouble relaxing 3 3  Restless 1 2  Easily annoyed or irritable 3 3  Afraid - awful might happen 3 3  Total GAD 7 Score 18 20  Anxiety Difficulty Not difficult at all Somewhat difficult    Review of Systems  All other systems reviewed and are negative.      Objective:   Physical Exam A&Ox4 Well groomed female Vitals with BMI 02/17/2019 01/28/2019 12/20/2018  Height 5\' 5"  5\' 5"  -  Weight 253 lbs 6 oz 250 lbs -  BMI 123456 [id number] -  Systolic [id number]  Diastolic 64 76 62  Pulse 68 74 86  Some encounter information is confidential and restricted. Go to Review Flowsheets activity to see all data.  PE not indicated    Assessment:     Encounter weight management BMI 42 Sleep disturance    Plan:     Discussed controlled substance agreement form and patient signed it today. Discussed adipex and how it can conter-affect sleep and she will let  me know if she has an issue. Plans to walk for 30-45 minutes daily 3-4 times a week, and we discussed increasing water intake and decreasing portions and calorie intake. Goal is at least 10% weight loss in next 3 months. 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 Triazolam refilled  Chivas Notz,CNM

## 2019-02-18 ENCOUNTER — Encounter: Payer: 59 | Admitting: Obstetrics and Gynecology

## 2019-02-24 ENCOUNTER — Encounter: Payer: Self-pay | Admitting: Urology

## 2019-02-24 ENCOUNTER — Ambulatory Visit (INDEPENDENT_AMBULATORY_CARE_PROVIDER_SITE_OTHER): Payer: BC Managed Care – PPO | Admitting: Urology

## 2019-02-24 ENCOUNTER — Other Ambulatory Visit: Payer: Self-pay

## 2019-02-24 VITALS — BP 106/67 | HR 96 | Ht 65.0 in | Wt 254.0 lb

## 2019-02-24 DIAGNOSIS — N390 Urinary tract infection, site not specified: Secondary | ICD-10-CM

## 2019-02-24 DIAGNOSIS — R3989 Other symptoms and signs involving the genitourinary system: Secondary | ICD-10-CM | POA: Diagnosis not present
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
## 2019-03-16 ENCOUNTER — Encounter: Payer: 59 | Admitting: Obstetrics and Gynecology

## 2019-03-17 ENCOUNTER — Ambulatory Visit: Payer: BC Managed Care – PPO | Admitting: Certified Nurse Midwife

## 2019-03-18 ENCOUNTER — Ambulatory Visit: Payer: BC Managed Care – PPO | Admitting: Obstetrics and Gynecology

## 2019-03-24 ENCOUNTER — Encounter: Payer: Self-pay | Admitting: Certified Nurse Midwife

## 2019-03-24 ENCOUNTER — Ambulatory Visit (INDEPENDENT_AMBULATORY_CARE_PROVIDER_SITE_OTHER): Payer: BC Managed Care – PPO | Admitting: Certified Nurse Midwife

## 2019-03-24 ENCOUNTER — Other Ambulatory Visit: Payer: Self-pay

## 2019-03-24 VITALS — BP 100/60 | HR 74 | Ht 65.0 in | Wt 252.2 lb

## 2019-03-24 DIAGNOSIS — E669 Obesity, unspecified: Secondary | ICD-10-CM | POA: Diagnosis not present

## 2019-03-24 DIAGNOSIS — Z79899 Other long term (current) drug therapy: Secondary | ICD-10-CM

## 2019-03-24 DIAGNOSIS — Z6841 Body Mass Index (BMI) 40.0 and over, adult: Secondary | ICD-10-CM

## 2019-03-24 MED ORDER — CYANOCOBALAMIN 1000 MCG/ML IJ SOLN
1000.0000 ug | Freq: Once | INTRAMUSCULAR | Status: AC
  Administered 2019-03-24: 1000 ug via INTRAMUSCULAR

## 2019-03-24 NOTE — Patient Instructions (Signed)
° °Calorie Counting for Weight Loss °Calories are units of energy. Your body needs a certain amount of calories from food to keep you going throughout the day. When you eat more calories than your body needs, your body stores the extra calories as fat. When you eat fewer calories than your body needs, your body burns fat to get the energy it needs. °Calorie counting means keeping track of how many calories you eat and drink each day. Calorie counting can be helpful if you need to lose weight. If you make sure to eat fewer calories than your body needs, you should lose weight. Ask your health care provider what a healthy weight is for you. °For calorie counting to work, you will need to eat the right number of calories in a day in order to lose a healthy amount of weight per week. A dietitian can help you determine how many calories you need in a day and will give you suggestions on how to reach your calorie goal. °· A healthy amount of weight to lose per week is usually 1-2 lb (0.5-0.9 kg). This usually means that your daily calorie intake should be reduced by 500-750 calories. °· Eating 1,200 - 1,500 calories per day can help most women lose weight. °· Eating 1,500 - 1,800 calories per day can help most men lose weight. °What is my plan? °My goal is to have __________ calories per day. °If I have this many calories per day, I should lose around __________ pounds per week. °What do I need to know about calorie counting? °In order to meet your daily calorie goal, you will need to: °· Find out how many calories are in each food you would like to eat. Try to do this before you eat. °· Decide how much of the food you plan to eat. °· Write down what you ate and how many calories it had. Doing this is called keeping a food log. °To successfully lose weight, it is important to balance calorie counting with a healthy lifestyle that includes regular activity. Aim for 150 minutes of moderate exercise (such as walking) or 75  minutes of vigorous exercise (such as running) each week. °Where do I find calorie information? ° °The number of calories in a food can be found on a Nutrition Facts label. If a food does not have a Nutrition Facts label, try to look up the calories online or ask your dietitian for help. °Remember that calories are listed per serving. If you choose to have more than one serving of a food, you will have to multiply the calories per serving by the amount of servings you plan to eat. For example, the label on a package of bread might say that a serving size is 1 slice and that there are 90 calories in a serving. If you eat 1 slice, you will have eaten 90 calories. If you eat 2 slices, you will have eaten 180 calories. °How do I keep a food log? °Immediately after each meal, record the following information in your food log: °· What you ate. Don't forget to include toppings, sauces, and other extras on the food. °· How much you ate. This can be measured in cups, ounces, or number of items. °· How many calories each food and drink had. °· The total number of calories in the meal. °Keep your food log near you, such as in a small notebook in your pocket, or use a mobile app or website. Some programs will   calculate calories for you and show you how many calories you have left for the day to meet your goal. °What are some calorie counting tips? ° °· Use your calories on foods and drinks that will fill you up and not leave you hungry: °? Some examples of foods that fill you up are nuts and nut butters, vegetables, lean proteins, and high-fiber foods like whole grains. High-fiber foods are foods with more than 5 g fiber per serving. °? Drinks such as sodas, specialty coffee drinks, alcohol, and juices have a lot of calories, yet do not fill you up. °· Eat nutritious foods and avoid empty calories. Empty calories are calories you get from foods or beverages that do not have many vitamins or protein, such as candy, sweets, and  soda. It is better to have a nutritious high-calorie food (such as an avocado) than a food with few nutrients (such as a bag of chips). °· Know how many calories are in the foods you eat most often. This will help you calculate calorie counts faster. °· Pay attention to calories in drinks. Low-calorie drinks include water and unsweetened drinks. °· Pay attention to nutrition labels for "low fat" or "fat free" foods. These foods sometimes have the same amount of calories or more calories than the full fat versions. They also often have added sugar, starch, or salt, to make up for flavor that was removed with the fat. °· Find a way of tracking calories that works for you. Get creative. Try different apps or programs if writing down calories does not work for you. °What are some portion control tips? °· Know how many calories are in a serving. This will help you know how many servings of a certain food you can have. °· Use a measuring cup to measure serving sizes. You could also try weighing out portions on a kitchen scale. With time, you will be able to estimate serving sizes for some foods. °· Take some time to put servings of different foods on your favorite plates, bowls, and cups so you know what a serving looks like. °· Try not to eat straight from a bag or box. Doing this can lead to overeating. Put the amount you would like to eat in a cup or on a plate to make sure you are eating the right portion. °· Use smaller plates, glasses, and bowls to prevent overeating. °· Try not to multitask (for example, watch TV or use your computer) while eating. If it is time to eat, sit down at a table and enjoy your food. This will help you to know when you are full. It will also help you to be aware of what you are eating and how much you are eating. °What are tips for following this plan? °Reading food labels °· Check the calorie count compared to the serving size. The serving size may be smaller than what you are used to  eating. °· Check the source of the calories. Make sure the food you are eating is high in vitamins and protein and low in saturated and trans fats. °Shopping °· Read nutrition labels while you shop. This will help you make healthy decisions before you decide to purchase your food. °· Make a grocery list and stick to it. °Cooking °· Try to cook your favorite foods in a healthier way. For example, try baking instead of frying. °· Use low-fat dairy products. °Meal planning °· Use more fruits and vegetables. Half of your plate should be   fruits and vegetables. °· Include lean proteins like poultry and fish. °How do I count calories when eating out? °· Ask for smaller portion sizes. °· Consider sharing an entree and sides instead of getting your own entree. °· If you get your own entree, eat only half. Ask for a box at the beginning of your meal and put the rest of your entree in it so you are not tempted to eat it. °· If calories are listed on the menu, choose the lower calorie options. °· Choose dishes that include vegetables, fruits, whole grains, low-fat dairy products, and lean protein. °· Choose items that are boiled, broiled, grilled, or steamed. Stay away from items that are buttered, battered, fried, or served with cream sauce. Items labeled "crispy" are usually fried, unless stated otherwise. °· Choose water, low-fat milk, unsweetened iced tea, or other drinks without added sugar. If you want an alcoholic beverage, choose a lower calorie option such as a glass of wine or light beer. °· Ask for dressings, sauces, and syrups on the side. These are usually high in calories, so you should limit the amount you eat. °· If you want a salad, choose a garden salad and ask for grilled meats. Avoid extra toppings like bacon, cheese, or fried items. Ask for the dressing on the side, or ask for olive oil and vinegar or lemon to use as dressing. °· Estimate how many servings of a food you are given. For example, a serving of  cooked rice is ½ cup or about the size of half a baseball. Knowing serving sizes will help you be aware of how much food you are eating at restaurants. The list below tells you how big or small some common portion sizes are based on everyday objects: °? 1 oz--4 stacked dice. °? 3 oz--1 deck of cards. °? 1 tsp--1 die. °? 1 Tbsp--½ a ping-pong ball. °? 2 Tbsp--1 ping-pong ball. °? ½ cup--½ baseball. °? 1 cup--1 baseball. °Summary °· Calorie counting means keeping track of how many calories you eat and drink each day. If you eat fewer calories than your body needs, you should lose weight. °· A healthy amount of weight to lose per week is usually 1-2 lb (0.5-0.9 kg). This usually means reducing your daily calorie intake by 500-750 calories. °· The number of calories in a food can be found on a Nutrition Facts label. If a food does not have a Nutrition Facts label, try to look up the calories online or ask your dietitian for help. °· Use your calories on foods and drinks that will fill you up, and not on foods and drinks that will leave you hungry. °· Use smaller plates, glasses, and bowls to prevent overeating. °This information is not intended to replace advice given to you by your health care provider. Make sure you discuss any questions you have with your health care provider. °Document Released: 04/01/2005 Document Revised: 12/19/2017 Document Reviewed: 03/01/2016 °Elsevier Patient Education © 2020 [employer] ° °

## 2019-03-24 NOTE — Addendum Note (Signed)
Addended by: Hildred Priest on: 03/24/2019 10:50 AM   Modules accepted: Level of Service

## 2019-03-24 NOTE — Addendum Note (Signed)
Addended by: Hildred Priest on: 03/24/2019 09:38 AM   Modules accepted: Orders

## 2019-03-24 NOTE — Progress Notes (Addendum)
SUBJECTIVE:  33 y.o. here for follow-up weight loss visit, previously seen 4 weeks ago. Denies any concerns and feels like medication is not working. She denies feeling any boost in energy and or decrease in appetite. She is concerned that the vyvanse may be contributing to this. Discussed with pt the goals for wt loss with use of medication and recommended 5% wt loss in 3 months use. Discussed dietary changes and exercise. She admits to having made not changes in her diet or exercise routine.   OBJECTIVE:  BP 100/60   Pulse 74   Ht 5\' 5"  (1.651 m)   Wt 252 lb 4 oz (114.4 kg)   LMP 07/18/2017 (Exact Date)   BMI 41.98 kg/m   Body mass index is 41.98 kg/m. Patient appears well. ASSESSMENT:  Obesity- responding poorly to weight loss plan. Discussed diet and exercise. Encouraged use of app on phone to help her be accountable for her daily intake. Encouraged cutting out/ alternative health snacks. Give that the pt has made no life style changes and has not been exercising emphasized that medication alone will not cause weight loss.  PLAN: Discontinue medications. Referral placed for nutrition consult. Pt agrees that since she is not responding to medication that she would like to stop  At this time.  RTC prn  I attest more than 50% of visit spent reviewing history, discussing wt loss plan, diet and exercise, answering questions, and developing plan of care. Face to face time 10 min.    Philip Aspen, CNM

## 2019-04-07 ENCOUNTER — Other Ambulatory Visit: Payer: Self-pay

## 2019-04-07 MED ORDER — BUPROPION HCL ER (XL) 150 MG PO TB24: 300.0000 mg | ORAL_TABLET | Freq: Every day | ORAL | 0 refills | Status: AC

## 2019-05-13 ENCOUNTER — Other Ambulatory Visit: Payer: Self-pay

## 2019-05-13 ENCOUNTER — Encounter: Payer: BC Managed Care – PPO | Attending: Certified Nurse Midwife | Admitting: Dietician

## 2019-05-13 ENCOUNTER — Encounter: Payer: Self-pay | Admitting: Dietician

## 2019-05-13 VITALS — Ht 65.0 in | Wt 269.8 lb

## 2019-05-13 DIAGNOSIS — Z6841 Body Mass Index (BMI) 40.0 and over, adult: Secondary | ICD-10-CM | POA: Diagnosis not present

## 2019-05-13 NOTE — Progress Notes (Signed)
Medical Nutrition Therapy: Visit start time: 1330  end time: 1430  Assessment:  Diagnosis: obesity Past medical history: anxiety/ depression Psychosocial issues/ stress concerns: anxiety/ depression  Preferred learning method:  Sarah Lawrence . Hands-on   Current weight: 269.8lbs Height: 5'5" Medications, supplements: reconciled list in medical record.  Progress and evaluation:   Patient has tried taking phentermine, which did not work. She followed ketogenic diet somewhat. Also tried herbalife partial liquid diet and lost 30lbs but regained when she stopped.  Her weight increased when taking paxil and zoloft was hungry contantly; now has changed meds but seems unable to lose that weight.    She reports a family history of type 2 diabetes, and she is concerned she will develop diabetes if she does not make some lifestyle changes.   She states she is a picky eater; does not like any vegetables other than salad; does like most fruits.   Patient reports often eating large food portions, occasionally to the point of feeling ill/ unable to control/ stop eating.  Physical activity: no structured activity, sedentary job. Household chores, but little time for other activity -- small children in the home  Dietary Intake:  Usual eating pattern includes 3 meals and 0 snacks per day. Dining out frequency: 21 meals per week.  Breakfast: takeout meal grilled chicken biscuit with coffee, soda Snack: none Lunch: fast food 1-2pm sandwich and fries Snack: none Supper: usu takeout -- [place], [place], fast food Snack: none Beverages: mostly sodas regular, some water, coffee  Nutrition Care Education: Topics covered:  Basic nutrition: basic food groups, appropriate nutrient balance, appropriate meal and snack schedule, general nutrition guidelines    Weight control: importance of low sugar and low fat choices, portion control, estimated energy needs for weight loss at 1500kcal, provided guidance for  45% CHO, 25% protein, and 30% fat; beverage options for reducing sodas and increasing water; controlling carb intake to minimize blood sugar/ insulin fluctuations and perhaps better control hunger/ appetite; discussed importance of dealing with underlying cause(s) of emotional eating, and encouraged analyzing binge eating event to reduce chances of another event.  Advanced nutrition: meal planning and preparation strategies to increase homemade meals and reduce restaurant meals   Nutritional Diagnosis:  [place]-3.3 Overweight/obesity As related to excess calories and inactivity.  As evidenced by patient with current BMI of 44.9.  Intervention:   Instruction and discussion as noted above.  Patient is ready to work on gradual and manageable lifestyle changes to promote weight loss and reduce health risk.   Established goals for change with direction from patient.  Will email patient additional resources to aid in meal preparation.  Education Materials given:  . Plate Planner with food lists, sample meal pattern . sample menus . Meal Prep handout . Goals/ instructions   Learner/ who was taught:  . Patient   Level of understanding: Marland Kitchen Verbalizes/ demonstrates competency  Demonstrated degree of understanding via:   Teach back Learning barriers: . None  Willingness to learn/ readiness for change: . Acceptance, ready for change   Monitoring and Evaluation:  Dietary intake, exercise, and body weight      follow up: 06/10/19 at 2:15pm

## 2019-05-13 NOTE — Patient Instructions (Signed)
   Start planning for more homemade meals, fewer restaurant meals.  Reduce the amount of soda consumed and increase water. Or flavored water like Nest[employer] splash, propel water, or add sugar free flavoring like crystal light.   Work on portion control of starchy foods and meats, add fruits, salad when able for great nutrition and balance.

## 2019-05-18 ENCOUNTER — Encounter: Payer: BC Managed Care – PPO | Admitting: Obstetrics and Gynecology

## 2019-06-10 ENCOUNTER — Ambulatory Visit: Payer: BC Managed Care – PPO | Admitting: Dietician

## 2019-06-24 ENCOUNTER — Encounter: Payer: Self-pay | Admitting: Dietician

## 2019-06-24 NOTE — Progress Notes (Signed)
Have not heard back from patient to reschedule her cancelled appointment from 06/10/19. Sent notification to referring provider.

## 2019-07-08 ENCOUNTER — Encounter: Payer: BC Managed Care – PPO | Admitting: Obstetrics and Gynecology

## 2019-09-23 ENCOUNTER — Emergency Department
Admission: EM | Admit: 2019-09-23 | Discharge: 2019-09-23 | Disposition: A | Discharge status: Home / Self Care | Payer: BC Managed Care – PPO | Attending: Emergency Medicine | Admitting: Emergency Medicine

## 2019-09-23 ENCOUNTER — Other Ambulatory Visit: Payer: Self-pay

## 2019-09-23 ENCOUNTER — Emergency Department: Payer: BC Managed Care – PPO

## 2019-09-23 DIAGNOSIS — R42 Dizziness and giddiness: Secondary | ICD-10-CM | POA: Diagnosis not present

## 2019-09-23 DIAGNOSIS — Z5320 Procedure and treatment not carried out because of patient's decision for unspecified reasons: Secondary | ICD-10-CM | POA: Insufficient documentation

## 2019-09-23 DIAGNOSIS — R002 Palpitations: Secondary | ICD-10-CM | POA: Insufficient documentation

## 2019-09-23 DIAGNOSIS — R0602 Shortness of breath: Secondary | ICD-10-CM | POA: Diagnosis not present

## 2019-09-23 DIAGNOSIS — R079 Chest pain, unspecified: Secondary | ICD-10-CM | POA: Insufficient documentation

## 2019-09-23 LAB — BASIC METABOLIC PANEL
Anion gap: 6 (ref 5–15)
BUN: 13 mg/dL (ref 6–20)
CO2: 27 mmol/L (ref 22–32)
Calcium: 9.4 mg/dL (ref 8.9–10.3)
Chloride: 104 mmol/L (ref 98–111)
Creatinine, Ser: 1.13 mg/dL — ABNORMAL HIGH (ref 0.44–1.00)
GFR calc Af Amer: >60 mL/min (ref >60)
GFR calc non Af Amer: >60 mL/min (ref >60)
Glucose, Bld: 72 mg/dL (ref 70–99)
Potassium: 3.8 mmol/L (ref 3.5–5.1)
Sodium: 137 mmol/L (ref 135–145)

## 2019-09-23 LAB — CBC
HCT: 41.9 % (ref 36.0–46.0)
Hemoglobin: 14.5 g/dL (ref 12.0–15.0)
MCH: 28.8 pg (ref 26.0–34.0)
MCHC: 34.6 g/dL (ref 30.0–36.0)
MCV: 83.3 fL (ref 80.0–100.0)
Platelets: 184 10*3/uL (ref 150–400)
RBC: 5.03 MIL/uL (ref 3.87–5.11)
RDW: 12.7 % (ref 11.5–15.5)
WBC: 10.3 10*3/uL (ref 4.0–10.5)
nRBC: 0 % (ref 0.0–0.2)

## 2019-09-23 LAB — POCT PREGNANCY, URINE: Preg Test, Ur: NEGATIVE

## 2019-09-23 LAB — TROPONIN I (HIGH SENSITIVITY): Troponin I (High Sensitivity): 3 ng/L (ref <18)

## 2019-09-23 NOTE — ED Triage Notes (Signed)
Pt arrives to ED via POV from home with c/o CP, palpitations, and rapid heart rate since last night. Pt reports at 10am this morning started having sharp pains. Pt reports chest pain feels like "tightness", no radiation. Pt denies N/V; reports intermittent dizziness and "occasional" SHOB. Pt reports seeing a Cardiologist for same s/x's in May of 2021 and "everything looked okay". Pt is A&O, in NAD; RR even, regular, and unlabored.

## 2020-05-11 IMAGING — DX DG CHEST 1V PORT
1 series · 1 of 1 positions shown · non-contrast
Comparison: None.

CLINICAL DATA: Cough. HHEPP-HB positive

EXAM:
PORTABLE CHEST 1 VIEW

[chest ap]
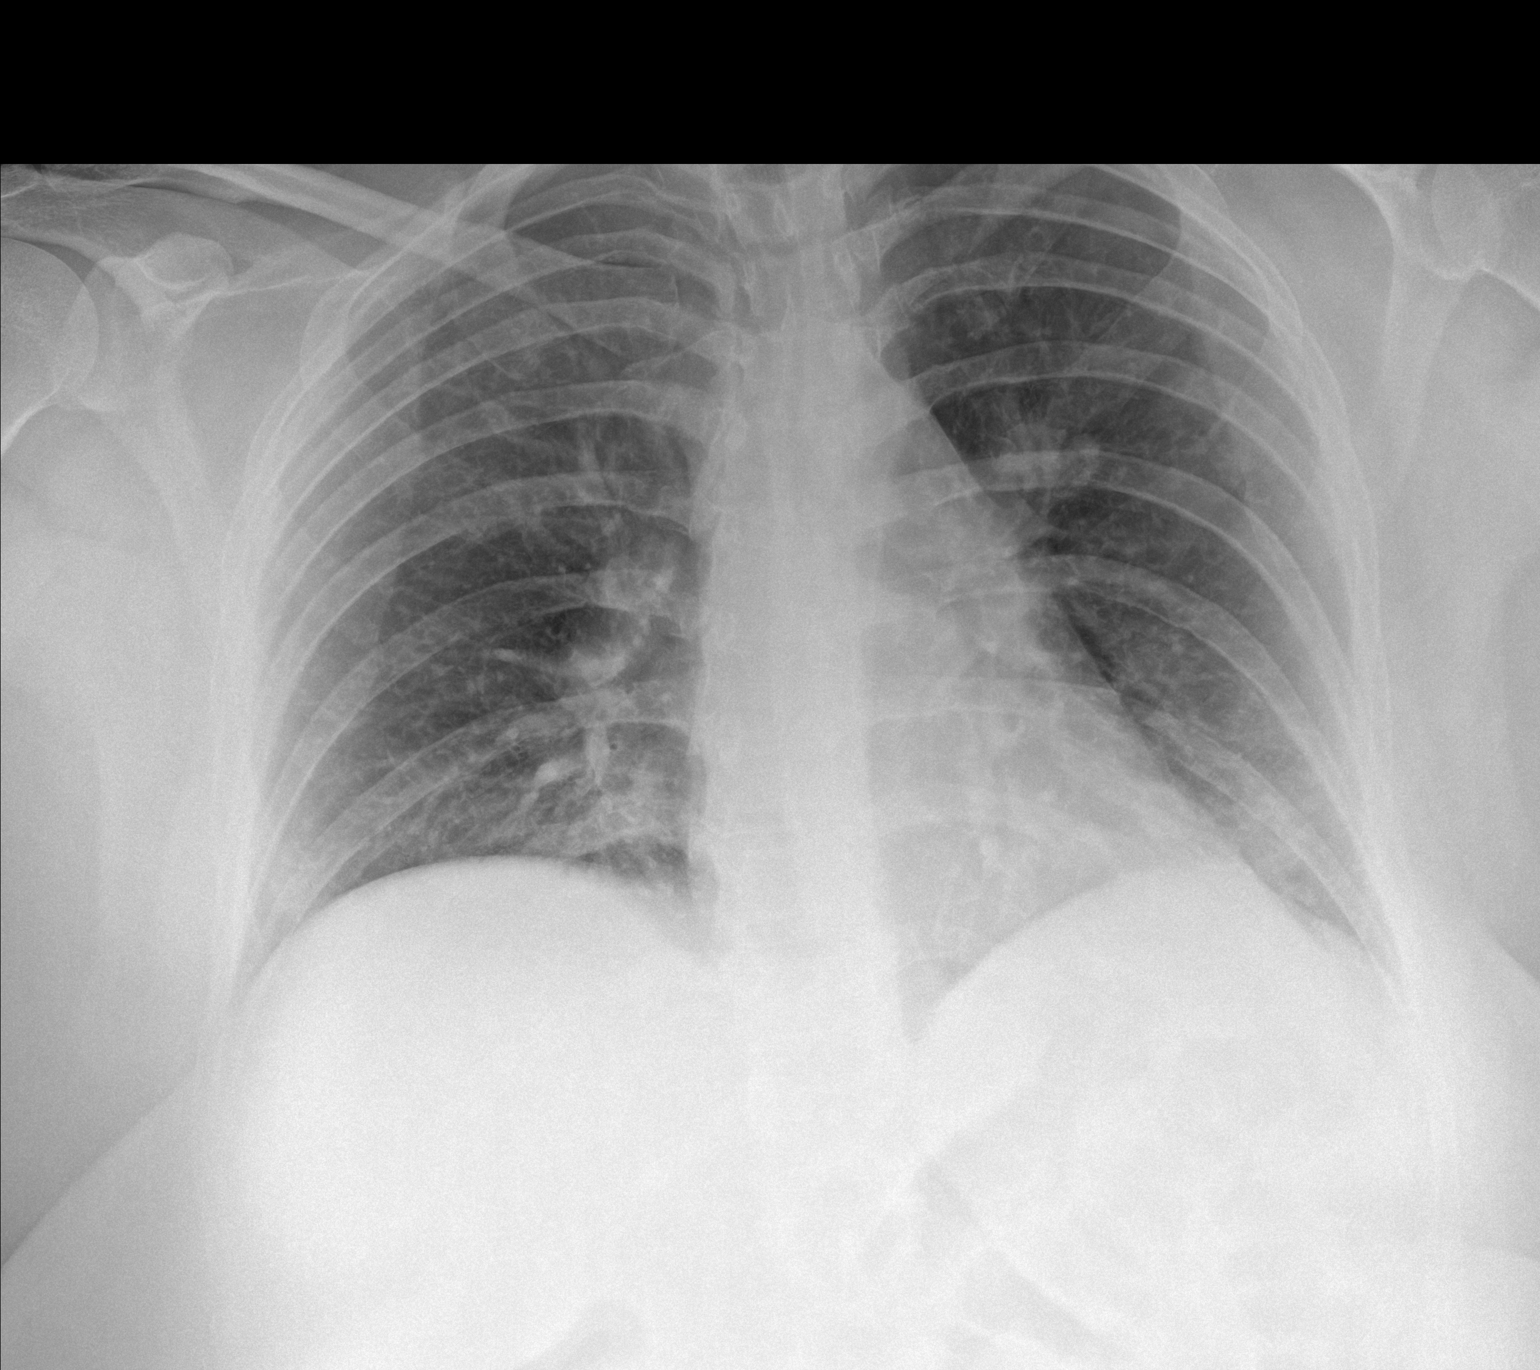

[1 of 1 positions shown; findings below may reference images not displayed]

FINDINGS: Cardiomediastinal silhouette is normal. Mediastinal contours appear
intact.

Mild bilateral lower lobe predominant ground-glass airspace
opacities.

Osseous structures are without acute abnormality. Soft tissues are
grossly normal.
IMPRESSION: Mild bilateral lower lobe predominant ground-glass airspace
opacities, consistent with multifocal atypical pneumonia.

## 2020-11-08 ENCOUNTER — Other Ambulatory Visit: Payer: Self-pay

## 2020-11-08 ENCOUNTER — Ambulatory Visit
Admission: RE | Admit: 2020-11-08 | Discharge: 2020-11-08 | Disposition: A | Discharge status: Home / Self Care | Payer: BC Managed Care – PPO | Source: Ambulatory Visit | Attending: Family Medicine | Admitting: Family Medicine

## 2020-11-08 VITALS — BP 108/64 | HR 66 | Temp 98.0°F | Resp 20 | Wt 208.0 lb

## 2020-11-08 DIAGNOSIS — R300 Dysuria: Secondary | ICD-10-CM

## 2020-11-08 LAB — URINALYSIS, COMPLETE (UACMP) WITH MICROSCOPIC
Bilirubin Urine: NEGATIVE
Glucose, UA: NEGATIVE mg/dL
Hgb urine dipstick: NEGATIVE
Ketones, ur: NEGATIVE mg/dL
Leukocytes,Ua: NEGATIVE
Nitrite: NEGATIVE
Protein, ur: NEGATIVE mg/dL
Specific Gravity, Urine: 1.01 (ref 1.005–1.030)
pH: 6 (ref 5.0–8.0)

## 2020-11-08 MED ORDER — PHENAZOPYRIDINE HCL 100 MG PO TABS
100.0000 mg | ORAL_TABLET | Freq: Three times a day (TID) | ORAL | 0 refills | Status: DC | PRN
Start: 2020-11-08 — End: 2020-11-22

## 2020-11-08 NOTE — ED Triage Notes (Signed)
Pt c/o urinary frequency, burning, and abdominal discomfort that started this morning.

## 2020-11-08 NOTE — Discharge Instructions (Addendum)
Lots of fluids.  Medication as needed.  If you worsen, please let us know.  Dr. Lacinda Axon
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
## 2020-11-10 LAB — URINE CULTURE: Culture: <10000 — AB

## 2020-11-22 ENCOUNTER — Ambulatory Visit
Admission: EM | Admit: 2020-11-22 | Discharge: 2020-11-22 | Disposition: A | Discharge status: Home / Self Care | Payer: BC Managed Care – PPO | Attending: Family Medicine | Admitting: Family Medicine

## 2020-11-22 ENCOUNTER — Other Ambulatory Visit: Payer: Self-pay

## 2020-11-22 ENCOUNTER — Encounter: Payer: Self-pay | Admitting: Emergency Medicine

## 2020-11-22 DIAGNOSIS — J0100 Acute maxillary sinusitis, unspecified: Secondary | ICD-10-CM | POA: Diagnosis not present

## 2020-11-22 MED ORDER — AMOXICILLIN-POT CLAVULANATE 875-125 MG PO TABS: 1.0000 | ORAL_TABLET | Freq: Two times a day (BID) | ORAL | 0 refills | Status: DC

## 2020-11-22 NOTE — ED Triage Notes (Signed)
Woke Saturday not feeling well.  Covid and flu tests this week were negative.  Patient has sinus congestion, cough at night, facial pain and headache.  Using mucinex, sinus rinse and no relief  Patient has fluid in ears
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
## 2021-10-18 ENCOUNTER — Ambulatory Visit
Admission: EM | Admit: 2021-10-18 | Discharge: 2021-10-18 | Disposition: A | Discharge status: Home / Self Care | Payer: BC Managed Care – PPO

## 2021-10-18 ENCOUNTER — Other Ambulatory Visit: Payer: Self-pay

## 2021-10-18 ENCOUNTER — Encounter: Payer: Self-pay | Admitting: Emergency Medicine

## 2021-10-18 DIAGNOSIS — J069 Acute upper respiratory infection, unspecified: Secondary | ICD-10-CM

## 2021-10-18 MED ORDER — AMOXICILLIN-POT CLAVULANATE 875-125 MG PO TABS: 1.0000 | ORAL_TABLET | Freq: Two times a day (BID) | ORAL | 0 refills | Status: DC

## 2021-10-18 MED ORDER — BENZONATATE 100 MG PO CAPS: 100.0000 mg | ORAL_CAPSULE | Freq: Three times a day (TID) | ORAL | 0 refills | Status: AC | PRN

## 2021-10-18 NOTE — ED Triage Notes (Signed)
Reports onset 8 days ago.  Pain in face, congestion, sob and muffled left ear and coughing.  Patient has general malaise.
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
## 2021-10-18 NOTE — Discharge Instructions (Addendum)
Start the Augmentin 875 mg BID for 10 days She will continue with Mucinex DM BID  Hydrate well and treat symptoms.  Benzonatate 100 mg TID for cough

## 2023-02-16 ENCOUNTER — Ambulatory Visit
Admission: EM | Admit: 2023-02-16 | Discharge: 2023-02-16 | Disposition: A | Discharge status: Home / Self Care | Payer: BC Managed Care – PPO | Attending: Family Medicine | Admitting: Family Medicine

## 2023-02-16 DIAGNOSIS — B9689 Other specified bacterial agents as the cause of diseases classified elsewhere: Secondary | ICD-10-CM | POA: Diagnosis present

## 2023-02-16 DIAGNOSIS — R300 Dysuria: Secondary | ICD-10-CM | POA: Insufficient documentation

## 2023-02-16 DIAGNOSIS — N760 Acute vaginitis: Secondary | ICD-10-CM | POA: Insufficient documentation

## 2023-02-16 LAB — WET PREP, GENITAL
Trich, Wet Prep: NONE SEEN
WBC, Wet Prep HPF POC: <10 — AB (ref <10)
Yeast Wet Prep HPF POC: NONE SEEN

## 2023-02-16 LAB — URINALYSIS, W/ REFLEX TO CULTURE (INFECTION SUSPECTED)
Bilirubin Urine: NEGATIVE
Glucose, UA: NEGATIVE mg/dL
Hgb urine dipstick: NEGATIVE
Ketones, ur: NEGATIVE mg/dL
Nitrite: NEGATIVE
Protein, ur: NEGATIVE mg/dL
Specific Gravity, Urine: <1.005 — ABNORMAL LOW (ref 1.005–1.030)
pH: 6 (ref 5.0–8.0)

## 2023-02-16 MED ORDER — METRONIDAZOLE 500 MG PO TABS: 500.0000 mg | ORAL_TABLET | Freq: Two times a day (BID) | ORAL | 0 refills | Status: AC

## 2023-02-16 NOTE — ED Triage Notes (Signed)
Pt c/o urinary freq,burning & pain since today. Denies any hematuria.
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
## 2023-02-16 NOTE — Discharge Instructions (Signed)
Stop by the pharmacy to pick up your prescriptions.  Follow up with your primary care provider or gynecologist for further evaluation.

## 2023-02-20 ENCOUNTER — Telehealth: Payer: Self-pay | Admitting: Family Medicine

## 2023-02-20 ENCOUNTER — Telehealth: Payer: Self-pay | Admitting: Cardiology

## 2023-02-20 LAB — URINE CULTURE: Culture: >=100000 — AB

## 2023-02-20 MED ORDER — NITROFURANTOIN MONOHYD MACRO 100 MG PO CAPS
100.0000 mg | ORAL_CAPSULE | Freq: Two times a day (BID) | ORAL | 0 refills | Status: AC
Start: 2023-02-20 — End: ?

## 2023-02-20 NOTE — Telephone Encounter (Signed)
Patient with positive urine culture for Enterococcus faecalis was sensitive to Macrobid.  Patient called and she can take this medication.  Macrobid twice daily for 5 days sent to preferred pharmacy.  Katha Cabal, DO

## 2023-02-21 ENCOUNTER — Telehealth: Payer: Self-pay

## 2023-02-21 ENCOUNTER — Telehealth: Payer: Self-pay | Admitting: Emergency Medicine

## 2023-02-21 NOTE — Telephone Encounter (Signed)
TC to pt to f/u from yesterday's VM.  Pt reports she is now having bilat flank pain, hematuria, and nausea, as well as continuation of original complaint. Has concern for pyelo. Pt asked if she should continue Macrobid or if she may need alternative abx.  Please advise.

## 2023-02-21 NOTE — Telephone Encounter (Signed)
TC to pt to advise of PA's recommendations. Pt states she will return to UC this afternoon for Rocephin injection per PA's rec. Pt advised this will be a Nurse Visit.

## 2023-02-21 NOTE — Telephone Encounter (Signed)
Patient states that she is here for a Rocephin injection because she thinks that she has Pyelo due to now having bilateral back/flank pain.  Patient states that she was told to come here for a nurse visit to get a Rocephin injection and that she was on the schedule.  Patient was notified at the registration desk that she was not on a schedule and did not have an appointment here.  Patient was also informed that due to her having worsening and new symptoms that she would need to checked in to be assessed and better evaluated by our provider.  Patient became upset and asked if she would have to wait.  Patient was notified of the estimated wait time due to [place] only having one provider at the time and several patients ahead of her that have been waiting.  Patient then stated to me "I did not mean to snap at you." Patient then states that "I don't need to be seen", and stood up to leave.
